# Patient Record
Sex: Female | Born: 1960 | Race: White | Hispanic: No | Marital: Married | Smoking: Never smoker
Health system: Southern US, Community
[De-identification: ages and names within clinical notes are randomized; demographics above are authoritative.]

## PROBLEM LIST (undated history)

## (undated) DIAGNOSIS — K219 Gastro-esophageal reflux disease without esophagitis: Secondary | ICD-10-CM

## (undated) DIAGNOSIS — I10 Essential (primary) hypertension: Secondary | ICD-10-CM

## (undated) DIAGNOSIS — K5000 Crohn's disease of small intestine without complications: Secondary | ICD-10-CM

## (undated) DIAGNOSIS — T8859XA Other complications of anesthesia, initial encounter: Secondary | ICD-10-CM

## (undated) DIAGNOSIS — Z9889 Other specified postprocedural states: Secondary | ICD-10-CM

## (undated) DIAGNOSIS — M542 Cervicalgia: Secondary | ICD-10-CM

## (undated) DIAGNOSIS — K5790 Diverticulosis of intestine, part unspecified, without perforation or abscess without bleeding: Secondary | ICD-10-CM

## (undated) DIAGNOSIS — G8929 Other chronic pain: Secondary | ICD-10-CM

## (undated) DIAGNOSIS — F419 Anxiety disorder, unspecified: Secondary | ICD-10-CM

## (undated) DIAGNOSIS — E785 Hyperlipidemia, unspecified: Secondary | ICD-10-CM

## (undated) DIAGNOSIS — M1990 Unspecified osteoarthritis, unspecified site: Secondary | ICD-10-CM

## (undated) DIAGNOSIS — T7840XA Allergy, unspecified, initial encounter: Secondary | ICD-10-CM

## (undated) DIAGNOSIS — F329 Major depressive disorder, single episode, unspecified: Secondary | ICD-10-CM

## (undated) DIAGNOSIS — E119 Type 2 diabetes mellitus without complications: Secondary | ICD-10-CM

## (undated) DIAGNOSIS — F32A Depression, unspecified: Secondary | ICD-10-CM

## (undated) DIAGNOSIS — R112 Nausea with vomiting, unspecified: Secondary | ICD-10-CM

## (undated) HISTORY — DX: Other chronic pain: G89.29

## (undated) HISTORY — DX: Gastro-esophageal reflux disease without esophagitis: K21.9

## (undated) HISTORY — DX: Allergy, unspecified, initial encounter: T78.40XA

## (undated) HISTORY — DX: Essential (primary) hypertension: I10

## (undated) HISTORY — DX: Depression, unspecified: F32.A

## (undated) HISTORY — DX: Hyperlipidemia, unspecified: E78.5

## (undated) HISTORY — DX: Diverticulosis of intestine, part unspecified, without perforation or abscess without bleeding: K57.90

## (undated) HISTORY — DX: Crohn's disease of small intestine without complications: K50.00

## (undated) HISTORY — DX: Unspecified osteoarthritis, unspecified site: M19.90

## (undated) HISTORY — PX: CHOLECYSTECTOMY: SHX55

## (undated) HISTORY — DX: Anxiety disorder, unspecified: F41.9

## (undated) HISTORY — PX: EXTERNAL EAR SURGERY: SHX627

## (undated) HISTORY — DX: Cervicalgia: M54.2

## (undated) HISTORY — DX: Type 2 diabetes mellitus without complications: E11.9

## (undated) HISTORY — DX: Major depressive disorder, single episode, unspecified: F32.9

---

## 1988-01-09 HISTORY — PX: DG GALL BLADDER: HXRAD326

## 2005-01-08 HISTORY — PX: CERVICAL FUSION: SHX112

## 2017-01-24 ENCOUNTER — Encounter: Payer: Self-pay | Admitting: Primary Care

## 2017-01-24 ENCOUNTER — Ambulatory Visit: Payer: BC Managed Care – PPO | Admitting: Primary Care

## 2017-01-24 ENCOUNTER — Encounter (INDEPENDENT_AMBULATORY_CARE_PROVIDER_SITE_OTHER): Payer: Self-pay

## 2017-01-24 VITALS — BP 122/82 | HR 108 | Temp 98.0°F | Ht 65.0 in | Wt 185.1 lb

## 2017-01-24 DIAGNOSIS — I10 Essential (primary) hypertension: Secondary | ICD-10-CM

## 2017-01-24 DIAGNOSIS — N289 Disorder of kidney and ureter, unspecified: Secondary | ICD-10-CM | POA: Diagnosis not present

## 2017-01-24 DIAGNOSIS — G8929 Other chronic pain: Secondary | ICD-10-CM

## 2017-01-24 DIAGNOSIS — E785 Hyperlipidemia, unspecified: Secondary | ICD-10-CM | POA: Diagnosis not present

## 2017-01-24 DIAGNOSIS — M542 Cervicalgia: Secondary | ICD-10-CM | POA: Diagnosis not present

## 2017-01-24 DIAGNOSIS — K219 Gastro-esophageal reflux disease without esophagitis: Secondary | ICD-10-CM | POA: Insufficient documentation

## 2017-01-24 DIAGNOSIS — E119 Type 2 diabetes mellitus without complications: Secondary | ICD-10-CM

## 2017-01-24 DIAGNOSIS — F419 Anxiety disorder, unspecified: Secondary | ICD-10-CM | POA: Diagnosis not present

## 2017-01-24 DIAGNOSIS — F329 Major depressive disorder, single episode, unspecified: Secondary | ICD-10-CM | POA: Diagnosis not present

## 2017-01-24 DIAGNOSIS — E1165 Type 2 diabetes mellitus with hyperglycemia: Secondary | ICD-10-CM | POA: Insufficient documentation

## 2017-01-24 DIAGNOSIS — F32A Depression, unspecified: Secondary | ICD-10-CM | POA: Insufficient documentation

## 2017-01-24 MED ORDER — METFORMIN HCL 500 MG PO TABS: 500.0000 mg | ORAL_TABLET | Freq: Two times a day (BID) | ORAL | 1 refills | Status: DC

## 2017-01-24 NOTE — Progress Notes (Signed)
Subjective:    Patient ID: Cheyenne Flowers, female    DOB: [birth year], 57 y.o.   MRN: [id number]  HPI  Cheyenne Flowers is a 57 year old female who presents today to establish care and discuss the problems mentioned below. Will obtain old records.  1) Hyperlipidemia: Currently managed on atorvastatin 10 mg. Her last lipid panel was in early December 2018 with TC 151, LDL 84. She denies myalgias.  2) Chronic Neck Pain: Currently managed on gabapentin 1200 mg at bedtime. History of cervical spinal fusion in 2007, does experience residual nerve pain since surgery.  3) GERD: Currently managed on pantoprazole 40 mg. Her symptoms include esophageal burning, epigastric burning without her occasion. She will occasionally experience symptoms while on pantoprazole. She's never tired a lower dose.  4) Essential Hypertension: Currently managed on losartan 25 mg. Previously managed on lisinopril but this was discontinued given side effects of cough and throat congestion.  She thinks she is noticed some improvement with her throat congestion and has noticed resolve of her cough.  BP Readings from Last 3 Encounters:  01/24/17 122/82     5) Type 2 Diabetes: No prior history of diabetes. Recent A1C of 6.6 from early December 2018. She is not currently managed on medication.  She has a strong family history of diabetes in both her mother and brother.  Diet currently consists of:  Breakfast: Cereal, bacon/eggs/biscuits/hashbrowns Lunch: Frozen dinner, cafeteria food (grilled cheese, soup) Dinner: Restaurants,fast food Snacks: Popcorn, candy bars Desserts: Daily  Beverages: Diet mountain dew, kool-aid with sugar, now using Truvia, occasional sweet tea, water  Exercise: She does not currently exercise  6) Depression/Anxiety: Currently managed on nortriptyline 10 mg for which she's taken for 10 years. It was initially introduced for anxiety. She feels as though she does experience intermittent anxiety with  some depression and feels well managed on this medication.     Review of Systems  Constitutional: Negative for fatigue.  HENT: Positive for postnasal drip.   Eyes: Negative for visual disturbance.  Respiratory: Negative for shortness of breath.   Cardiovascular: Negative for chest pain.  Gastrointestinal:       GERD  Genitourinary:       IUD  Allergic/Immunologic: Positive for environmental allergies.  Neurological: Negative for dizziness, numbness and headaches.  Psychiatric/Behavioral:       See HPI       Past Medical History:  Diagnosis Date  . Allergy   . Arthritis   . GERD (gastroesophageal reflux disease)   . Hyperlipidemia   . Hypertension      Social History   Socioeconomic History  . Marital status: Married    Spouse name: Not on file  . Number of children: Not on file  . Years of education: Not on file  . Highest education level: Not on file  Social Needs  . Financial resource strain: Not on file  . Food insecurity - worry: Not on file  . Food insecurity - inability: Not on file  . Transportation needs - medical: Not on file  . Transportation needs - non-medical: Not on file  Occupational History  . Not on file  Tobacco Use  . Smoking status: Never Smoker  . Smokeless tobacco: Never Used  Substance and Sexual Activity  . Alcohol use: Not on file  . Drug use: Not on file  . Sexual activity: Not on file  Other Topics Concern  . Not on file  Social History Narrative   Married.   5  children, 9 grandchildren.   Works in administration.    Enjoys watching her grandchildren, spending time with family.      Family History  Problem Relation Age of Onset  . COPD Mother   . Diabetes Mother   . Heart disease Mother   . Diabetes Brother   . Hypertension Brother     Allergies  Allergen Reactions  . Latex Hives and Rash  . Clindamycin Rash  . Codeine Nausea Only    Other reaction(s): Other (See Comments) Sick feeling   . Quinolones Rash     Current Outpatient Medications on File Prior to Visit  Medication Sig Dispense Refill  . atorvastatin (LIPITOR) 10 MG tablet TAKE 1 TABLET BY MOUTH EVERY DAY    . cetirizine (ZYRTEC) 10 MG tablet Take 10 mg by mouth daily.     Marland Kitchen gabapentin (NEURONTIN) 600 MG tablet TAKE 2 TABLETS (1,200 MG TOTAL) BY MOUTH NIGHTLY  1  . losartan (COZAAR) 25 MG tablet Take 25 mg by mouth daily.  1  . nortriptyline (PAMELOR) 10 MG capsule TAKE 4 CAPSULES (40 MG TOTAL) BY MOUTH NIGHTLY. OR AS DIRECTED    . pantoprazole (PROTONIX) 40 MG tablet Take 40 mg by mouth.     No current facility-administered medications on file prior to visit.     BP 122/82   Pulse (!) 108   Temp 98 F (36.7 C) (Oral)   Ht 5\' 5"  (1.651 m)   Wt 185 lb 1.9 oz (84 kg)   SpO2 99%   BMI 30.81 kg/m    Objective:   Physical Exam  Constitutional: She appears well-nourished.  HENT:  Mouth/Throat: Oropharynx is clear and moist.  Neck: Neck supple.  Cardiovascular: Normal rate and regular rhythm.  Pulmonary/Chest: Effort normal and breath sounds normal.  Musculoskeletal: Normal range of motion.  Skin: Skin is warm and dry.  Psychiatric: She has a normal mood and affect.          Assessment & Plan:

## 2017-01-24 NOTE — Assessment & Plan Note (Signed)
Stable in the office today.  Continue losartan 25 mg for blood pressure control and also for renal protection against diabetes.

## 2017-01-24 NOTE — Assessment & Plan Note (Signed)
We will have her trial a reduction in her pantoprazole to 20 mg daily.  She will update if symptoms return.

## 2017-01-24 NOTE — Assessment & Plan Note (Signed)
Diagnosed in early December 2018, no current treatment in place.  Prescription for metformin 500 mg twice daily sent to pharmacy.  Discussed to start with 1 tablet once daily for 2 weeks, then increase to 1 tablet twice daily thereafter.  Managed on ARB and statin. Foot exam next visit. Will need pneumonia vaccination next visit.  We will have her work on her diet and start exercising regularly.  Follow-up in 3 months with repeat A1c.

## 2017-01-24 NOTE — Patient Instructions (Addendum)
Start metformin 500 mg tablets for diabetes. Take 1 tablet by mouth once daily for 2 weeks, then increase to 1 tablet twice daily thereafter.  Try to reduce the dose of your pantoprazole from 40 mg to 20 mg.   Avoid medications like Ibuprofen, Advil, Motrin, naproxen, Aleve as they can be toxic to the kidneys.  Schedule a follow up visit in 3 months with a lab visit 1-2 days prior. We will discuss your labs during your visit.  It was a pleasure to meet you today! Please don't hesitate to call or message me with any questions. Welcome to [facility]!    Diabetes Mellitus and Nutrition When you have diabetes (diabetes mellitus), it is very important to have healthy eating habits because your blood sugar (glucose) levels are greatly affected by what you eat and drink. Eating healthy foods in the appropriate amounts, at about the same times every day, can help you:  Control your blood glucose.  Lower your risk of heart disease.  Improve your blood pressure.  Reach or maintain a healthy weight.  Every person with diabetes is different, and each person has different needs for a meal plan. Your health care provider may recommend that you work with a diet and nutrition specialist (dietitian) to make a meal plan that is best for you. Your meal plan may vary depending on factors such as:  The calories you need.  The medicines you take.  Your weight.  Your blood glucose, blood pressure, and cholesterol levels.  Your activity level.  Other health conditions you have, such as heart or kidney disease.  How do carbohydrates affect me? Carbohydrates affect your blood glucose level more than any other type of food. Eating carbohydrates naturally increases the amount of glucose in your blood. Carbohydrate counting is a method for keeping track of how many carbohydrates you eat. Counting carbohydrates is important to keep your blood glucose at a healthy level, especially if you use insulin or take  certain oral diabetes medicines. It is important to know how many carbohydrates you can safely have in each meal. This is different for every person. Your dietitian can help you calculate how many carbohydrates you should have at each meal and for snack. Foods that contain carbohydrates include:  Bread, cereal, rice, pasta, and crackers.  Potatoes and corn.  Peas, beans, and lentils.  Milk and yogurt.  Fruit and juice.  Desserts, such as cakes, cookies, ice cream, and candy.  How does alcohol affect me? Alcohol can cause a sudden decrease in blood glucose (hypoglycemia), especially if you use insulin or take certain oral diabetes medicines. Hypoglycemia can be a life-threatening condition. Symptoms of hypoglycemia (sleepiness, dizziness, and confusion) are similar to symptoms of having too much alcohol. If your health care provider says that alcohol is safe for you, follow these guidelines:  Limit alcohol intake to no more than 1 drink per day for nonpregnant women and 2 drinks per day for men. One drink equals 12 oz of beer, 5 oz of wine, or 1 oz of hard liquor.  Do not drink on an empty stomach.  Keep yourself hydrated with water, diet soda, or unsweetened iced tea.  Keep in mind that regular soda, juice, and other mixers may contain a lot of sugar and must be counted as carbohydrates.  What are tips for following this plan? Reading food labels  Start by checking the serving size on the label. The amount of calories, carbohydrates, fats, and other nutrients listed on the  label are based on one serving of the food. Many foods contain more than one serving per package.  Check the total grams (g) of carbohydrates in one serving. You can calculate the number of servings of carbohydrates in one serving by dividing the total carbohydrates by 15. For example, if a food has 30 g of total carbohydrates, it would be equal to 2 servings of carbohydrates.  Check the number of grams (g) of  saturated and trans fats in one serving. Choose foods that have low or no amount of these fats.  Check the number of milligrams (mg) of sodium in one serving. Most people should limit total sodium intake to less than 2,300 mg per day.  Always check the nutrition information of foods labeled as "low-fat" or "nonfat". These foods may be higher in added sugar or refined carbohydrates and should be avoided.  Talk to your dietitian to identify your daily goals for nutrients listed on the label. Shopping  Avoid buying canned, premade, or processed foods. These foods tend to be high in fat, sodium, and added sugar.  Shop around the outside edge of the grocery store. This includes fresh fruits and vegetables, bulk grains, fresh meats, and fresh dairy. Cooking  Use low-heat cooking methods, such as baking, instead of high-heat cooking methods like deep frying.  Cook using healthy oils, such as olive, canola, or sunflower oil.  Avoid cooking with butter, cream, or high-fat meats. Meal planning  Eat meals and snacks regularly, preferably at the same times every day. Avoid going long periods of time without eating.  Eat foods high in fiber, such as fresh fruits, vegetables, beans, and whole grains. Talk to your dietitian about how many servings of carbohydrates you can eat at each meal.  Eat 4-6 ounces of lean protein each day, such as lean meat, chicken, fish, eggs, or tofu. 1 ounce is equal to 1 ounce of meat, chicken, or fish, 1 egg, or 1/4 cup of tofu.  Eat some foods each day that contain healthy fats, such as avocado, nuts, seeds, and fish. Lifestyle   Check your blood glucose regularly.  Exercise at least 30 minutes 5 or more days each week, or as told by your health care provider.  Take medicines as told by your health care provider.  Do not use any products that contain nicotine or tobacco, such as cigarettes and e-cigarettes. If you need help quitting, ask your health care  provider.  Work with a Veterinary surgeoncounselor or diabetes educator to identify strategies to manage stress and any emotional and social challenges. What are some questions to ask my health care provider?  Do I need to meet with a diabetes educator?  Do I need to meet with a dietitian?  What number can I call if I have questions?  When are the best times to check my blood glucose? Where to find more information:  American Diabetes Association: diabetes.org/food-and-fitness/food  Academy of Nutrition and Dietetics: https://www.vargas.com/www.eatright.org/resources/health/diseases-and-conditions/diabetes  [employer] Institute of Diabetes and Digestive and Kidney Diseases (NIH): FindJewelers.czwww.niddk.nih.gov/health-information/diabetes/overview/diet-eating-physical-activity Summary  A healthy meal plan will help you control your blood glucose and maintain a healthy lifestyle.  Working with a diet and nutrition specialist (dietitian) can help you make a meal plan that is best for you.  Keep in mind that carbohydrates and alcohol have immediate effects on your blood glucose levels. It is important to count carbohydrates and to use alcohol carefully. This information is not intended to replace advice given to you by your health care  provider. Make sure you discuss any questions you have with your health care provider. Document Released: 09/21/2004 Document Revised: 01/30/2016 Document Reviewed: 01/30/2016 Elsevier Interactive Patient Education  [employer].

## 2017-01-24 NOTE — Assessment & Plan Note (Signed)
Managed on nortriptyline 10 mg for years, doing well.  Continue same.

## 2017-01-24 NOTE — Assessment & Plan Note (Signed)
GFR of 58 from labs in December 2018. We will continue to monitor renal function with repeat in 3 months at her next visit. Discussed to avoid nephrotoxic agents such as NSAIDs.

## 2017-01-24 NOTE — Assessment & Plan Note (Signed)
Lipid panel reviewed from early December 2018 and is stable.  Continue atorvastatin 10 mg.

## 2017-01-24 NOTE — Assessment & Plan Note (Signed)
Overall doing better on gabapentin 1200 mg at bedtime.  Has weaned herself down over time on gabapentin.  Continue same.

## 2017-02-08 LAB — HM DIABETES EYE EXAM

## 2017-02-12 ENCOUNTER — Encounter: Payer: Self-pay | Admitting: Primary Care

## 2017-02-12 DIAGNOSIS — E119 Type 2 diabetes mellitus without complications: Secondary | ICD-10-CM

## 2017-02-12 MED ORDER — METFORMIN HCL ER 500 MG PO TB24: 500.0000 mg | ORAL_TABLET | Freq: Every day | ORAL | 0 refills | Status: DC

## 2017-03-18 ENCOUNTER — Encounter: Payer: Self-pay | Admitting: Primary Care

## 2017-03-18 DIAGNOSIS — E785 Hyperlipidemia, unspecified: Secondary | ICD-10-CM

## 2017-03-18 DIAGNOSIS — I10 Essential (primary) hypertension: Secondary | ICD-10-CM

## 2017-03-18 MED ORDER — LOSARTAN POTASSIUM 25 MG PO TABS: 25.0000 mg | ORAL_TABLET | Freq: Every day | ORAL | 2 refills | Status: DC

## 2017-03-18 MED ORDER — ATORVASTATIN CALCIUM 10 MG PO TABS: 10.0000 mg | ORAL_TABLET | Freq: Every evening | ORAL | 2 refills | Status: DC

## 2017-03-18 NOTE — Telephone Encounter (Signed)
Ok to refill? Electronically refill request through MyChart. Medications have not been prescribed by Jae DireKate. Last seen on 01/24/2017.

## 2017-04-22 ENCOUNTER — Encounter: Payer: Self-pay | Admitting: Primary Care

## 2017-04-22 ENCOUNTER — Other Ambulatory Visit (INDEPENDENT_AMBULATORY_CARE_PROVIDER_SITE_OTHER): Payer: BC Managed Care – PPO

## 2017-04-22 DIAGNOSIS — E119 Type 2 diabetes mellitus without complications: Secondary | ICD-10-CM

## 2017-04-22 DIAGNOSIS — N289 Disorder of kidney and ureter, unspecified: Secondary | ICD-10-CM | POA: Diagnosis not present

## 2017-04-22 LAB — BASIC METABOLIC PANEL
BUN: 12 mg/dL (ref 6–23)
CALCIUM: 9.3 mg/dL (ref 8.4–10.5)
CO2: 27 meq/L (ref 19–32)
Chloride: 103 mEq/L (ref 96–112)
Creatinine, Ser: 0.96 mg/dL (ref 0.40–1.20)
GFR: 64.28 mL/min (ref >60.00)
Glucose, Bld: 126 mg/dL — ABNORMAL HIGH (ref 70–99)
Potassium: 4.1 mEq/L (ref 3.5–5.1)
SODIUM: 140 meq/L (ref 135–145)

## 2017-04-22 LAB — HEMOGLOBIN A1C: HEMOGLOBIN A1C: 6.3 % (ref 4.6–6.5)

## 2017-04-24 ENCOUNTER — Ambulatory Visit: Payer: BC Managed Care – PPO | Admitting: Primary Care

## 2017-04-24 ENCOUNTER — Encounter: Payer: Self-pay | Admitting: Primary Care

## 2017-04-24 VITALS — BP 114/70 | HR 103 | Temp 98.0°F | Ht 65.0 in | Wt 185.2 lb

## 2017-04-24 DIAGNOSIS — E119 Type 2 diabetes mellitus without complications: Secondary | ICD-10-CM

## 2017-04-24 DIAGNOSIS — Z23 Encounter for immunization: Secondary | ICD-10-CM | POA: Diagnosis not present

## 2017-04-24 DIAGNOSIS — F329 Major depressive disorder, single episode, unspecified: Secondary | ICD-10-CM | POA: Diagnosis not present

## 2017-04-24 DIAGNOSIS — F419 Anxiety disorder, unspecified: Secondary | ICD-10-CM | POA: Diagnosis not present

## 2017-04-24 DIAGNOSIS — K219 Gastro-esophageal reflux disease without esophagitis: Secondary | ICD-10-CM

## 2017-04-24 DIAGNOSIS — F32A Depression, unspecified: Secondary | ICD-10-CM

## 2017-04-24 MED ORDER — ZOSTER VAC RECOMB ADJUVANTED 50 MCG/0.5ML IM SUSR: 0.5000 mL | Freq: Once | INTRAMUSCULAR | 1 refills | Status: AC

## 2017-04-24 MED ORDER — PANTOPRAZOLE SODIUM 20 MG PO TBEC: 20.0000 mg | DELAYED_RELEASE_TABLET | Freq: Every day | ORAL | 1 refills | Status: DC

## 2017-04-24 NOTE — Progress Notes (Signed)
Subjective:    Patient ID: Cheyenne Flowers, female    DOB: 06-09-1960, 57 y.o.   MRN: [id number]  HPI  Cheyenne Flowers is a 57 year old female who presents today for diabetes follow up. She is interested in the Shingrix vaccination.  Current medications include: Metformin XR 500 mg daily  Last A1C: 6.6 in December 2018. 6.3 in April 2019.  Last Eye Exam: Completed in February 2019 Last Foot Exam: Due today Pneumonia Vaccination: Due today. ACE/ARB: Losartan  Statin: Lipitor   Diet currently consists of:  Breakfast: Cereal, eggs with cheese, hashbrowns Lunch: Caesar salad, fruit, grilled chicken breast, sandwich Dinner: Restaurants, meat, pasta, vegetables  Snacks: Almonds, fruit Desserts: Ice cream, chocolate. Daily consumption  Beverages: Hint water, [employer], diet soda  Exercise: She is not exercising, plans on starting.     Review of Systems  Respiratory: Negative for shortness of breath.   Cardiovascular: Negative for chest pain.  Gastrointestinal: Negative for diarrhea.  Neurological: Negative for dizziness, numbness and headaches.       Past Medical History:  Diagnosis Date  . Allergy   . Anxiety and depression   . Arthritis   . Chronic neck pain   . GERD (gastroesophageal reflux disease)   . Hyperlipidemia   . Hypertension   . Type 2 diabetes mellitus (HCC)      Social History   Socioeconomic History  . Marital status: Married    Spouse name: Not on file  . Number of children: Not on file  . Years of education: Not on file  . Highest education level: Not on file  Occupational History  . Not on file  Social Needs  . Financial resource strain: Not on file  . Food insecurity:    Worry: Not on file    Inability: Not on file  . Transportation needs:    Medical: Not on file    Non-medical: Not on file  Tobacco Use  . Smoking status: Never Smoker  . Smokeless tobacco: Never Used  Substance and Sexual Activity  . Alcohol use: Not on file  . Drug  use: Not on file  . Sexual activity: Not on file  Lifestyle  . Physical activity:    Days per week: Not on file    Minutes per session: Not on file  . Stress: Not on file  Relationships  . Social connections:    Talks on phone: Not on file    Gets together: Not on file    Attends religious service: Not on file    Active member of club or organization: Not on file    Attends meetings of clubs or organizations: Not on file    Relationship status: Not on file  . Intimate partner violence:    Fear of current or ex partner: Not on file    Emotionally abused: Not on file    Physically abused: Not on file    Forced sexual activity: Not on file  Other Topics Concern  . Not on file  Social History Narrative   Married.   5 children, 9 grandchildren.   Works in administration.    Enjoys watching her grandchildren, spending time with family.     Family History  Problem Relation Age of Onset  . COPD Mother   . Diabetes Mother   . Heart disease Mother   . Diabetes Brother   . Hypertension Brother     Allergies  Allergen Reactions  . Latex Hives and Rash  .  Clindamycin Rash  . Codeine Nausea Only    Other reaction(s): Other (See Comments) Sick feeling   . Quinolones Rash    Current Outpatient Medications on File Prior to Visit  Medication Sig Dispense Refill  . atorvastatin (LIPITOR) 10 MG tablet Take 1 tablet (10 mg total) by mouth every evening. 90 tablet 2  . cetirizine (ZYRTEC) 10 MG tablet Take 10 mg by mouth daily.     Marland Kitchen. gabapentin (NEURONTIN) 600 MG tablet TAKE 2 TABLETS (1,200 MG TOTAL) BY MOUTH NIGHTLY  1  . losartan (COZAAR) 25 MG tablet Take 1 tablet (25 mg total) by mouth daily. 90 tablet 2  . metFORMIN (GLUCOPHAGE XR) 500 MG 24 hr tablet Take 1 tablet (500 mg total) by mouth daily with breakfast. 90 tablet 0   No current facility-administered medications on file prior to visit.     BP 114/70   Pulse (!) 103   Temp 98 F (36.7 C) (Oral)   Ht 5\' 5"  (1.651  m)   Wt 185 lb 4 oz (84 kg)   SpO2 98%   BMI 30.83 kg/m    Objective:   Physical Exam  Constitutional: She appears well-nourished.  Neck: Neck supple.  Cardiovascular: Normal rate and regular rhythm.  Pulmonary/Chest: Effort normal and breath sounds normal.  Skin: Skin is warm and dry.          Assessment & Plan:

## 2017-04-24 NOTE — Assessment & Plan Note (Signed)
Weaned herself of nortriptyline and has been without for the past 2 nights. Also prescribed for IBS diarrhea. Overall doing fine off meds. She will update if symptoms return.

## 2017-04-24 NOTE — Patient Instructions (Addendum)
Continue Metformin XR 500 mg tablets for diabetes.  Start exercising. You should be getting 150 minutes of moderate intensity exercise weekly.  Continue to work on M.D.C. Holdingsyour diet, increase water intake.   Take the Shingles vaccination to your pharmacy no sooner than one month after the pneumonia vaccination.   Please schedule a follow up appointment in 6 months for diabetes follow up.   It was a pleasure to see you today!

## 2017-04-24 NOTE — Assessment & Plan Note (Signed)
A1C improved on Metformin XR 500 mg once daily, continue same.  Foot exam completed today. Pneumovax provided today. Managed on ARB and statin. Eye exam UTD.  Follow up in 6 months.

## 2017-05-01 ENCOUNTER — Other Ambulatory Visit: Payer: Self-pay | Admitting: Primary Care

## 2017-05-01 DIAGNOSIS — E119 Type 2 diabetes mellitus without complications: Secondary | ICD-10-CM

## 2017-05-16 ENCOUNTER — Ambulatory Visit: Payer: BC Managed Care – PPO | Admitting: Primary Care

## 2017-05-16 ENCOUNTER — Encounter: Payer: Self-pay | Admitting: Primary Care

## 2017-05-16 VITALS — BP 122/76 | HR 111 | Temp 98.1°F | Ht 65.0 in | Wt 185.0 lb

## 2017-05-16 DIAGNOSIS — K529 Noninfective gastroenteritis and colitis, unspecified: Secondary | ICD-10-CM

## 2017-05-16 DIAGNOSIS — R1084 Generalized abdominal pain: Secondary | ICD-10-CM | POA: Diagnosis not present

## 2017-05-16 HISTORY — DX: Generalized abdominal pain: R10.84

## 2017-05-16 LAB — COMPREHENSIVE METABOLIC PANEL
ALBUMIN: 4.2 g/dL (ref 3.5–5.2)
ALK PHOS: 81 U/L (ref 39–117)
ALT: 23 U/L (ref 0–35)
AST: 21 U/L (ref 0–37)
BUN: 10 mg/dL (ref 6–23)
CALCIUM: 9.6 mg/dL (ref 8.4–10.5)
CHLORIDE: 104 meq/L (ref 96–112)
CO2: 30 mEq/L (ref 19–32)
CREATININE: 0.98 mg/dL (ref 0.40–1.20)
GFR: 62.75 mL/min (ref >60.00)
Glucose, Bld: 113 mg/dL — ABNORMAL HIGH (ref 70–99)
POTASSIUM: 3.8 meq/L (ref 3.5–5.1)
Sodium: 140 mEq/L (ref 135–145)
TOTAL PROTEIN: 7.3 g/dL (ref 6.0–8.3)
Total Bilirubin: 0.9 mg/dL (ref 0.2–1.2)

## 2017-05-16 LAB — CBC WITH DIFFERENTIAL/PLATELET
BASOS PCT: 0.9 % (ref 0.0–3.0)
Basophils Absolute: 0.1 10*3/uL (ref 0.0–0.1)
EOS PCT: 3.2 % (ref 0.0–5.0)
Eosinophils Absolute: 0.3 10*3/uL (ref 0.0–0.7)
HEMATOCRIT: 39.8 % (ref 36.0–46.0)
HEMOGLOBIN: 13.3 g/dL (ref 12.0–15.0)
LYMPHS PCT: 29.2 % (ref 12.0–46.0)
Lymphs Abs: 2.5 10*3/uL (ref 0.7–4.0)
MCHC: 33.5 g/dL (ref 30.0–36.0)
MCV: 91.2 fl (ref 78.0–100.0)
MONO ABS: 0.7 10*3/uL (ref 0.1–1.0)
Monocytes Relative: 8.3 % (ref 3.0–12.0)
Neutro Abs: 5.1 10*3/uL (ref 1.4–7.7)
Neutrophils Relative %: 58.4 % (ref 43.0–77.0)
Platelets: 362 10*3/uL (ref 150.0–400.0)
RBC: 4.37 Mil/uL (ref 3.87–5.11)
RDW: 12.8 % (ref 11.5–15.5)
WBC: 8.7 10*3/uL (ref 4.0–10.5)

## 2017-05-16 MED ORDER — AMITRIPTYLINE HCL 10 MG PO TABS: ORAL_TABLET | ORAL | 0 refills | Status: DC

## 2017-05-16 NOTE — Patient Instructions (Signed)
Stop by the lab prior to leaving today. I will notify you of your results once received.   Start amitriptyline 10 mg tablets every night at bedtime for diarrhea.   Please update me in a few weeks.  It was a pleasure to see you today!

## 2017-05-16 NOTE — Assessment & Plan Note (Signed)
Since gall bladder removed in 1990. Chronic abdominal pain for years, worse recently. Suspect IBS related symptoms, diarrhea type. Suspect symptoms have become worse since coming off of nortriptyline.  Check labs including CBC, CMP. Given chronicity and complaints of increased abdominal pain, will check CT abdomen/pelvis to rule out any colon cause or masses.  Rx for amitriptyline sent to pharmacy. She will update in a few weeks.

## 2017-05-16 NOTE — Assessment & Plan Note (Signed)
Chronic for years, worse recently. Check CBC and CMP. Check CT abdomen/pelvis to rule out colon causes, masses, etc.

## 2017-05-16 NOTE — Progress Notes (Signed)
Subjective:    Patient ID: Cheyenne Flowers, female    DOB: 18-May-1960, 57 y.o.   MRN: [id number]  HPI  [name] is a 57 year old female with a history of type 2 diabetes, GERD who presents today with a chief complaint of abdominal pain. She also reports diarrhea and nausea.   Her pain is located to the left lateral side of her mid to lower abdomen that has been intermittent for the past one year. She'll also experience generalized abdominal pain several times weekly for which she describes as "a stomach ache". She's also experiencing diarrhea which has been chronic for years since removal of her gall bladder in 1990. Diarrhea will occur 2-3 times daily that will occur within minutes after eating a meal. She's been taking Imodium several times weekly (2-3 at a time) with temporary improvement. The main difference in her chronic symptoms is that her abdominal pain is worse.   She was once on nortriptyline 40 mg for diarrhea, stopped taking about 6 weeks ago. She has not identified any foods that make symptoms worse. She denies vomiting, rectal bleeding, unexplained weight loss. She underwent colonoscopy in 2015, no evidence of Crohn's, ulcerative colitis, or diverticulosis. History of cholecystectomy in 1990.   Her symptoms have significantly altered her daily routine. She can no longer go on long motorcycle rides with her husband as she'll have episodes of diarrhea and pain.  Review of Systems  Constitutional: Negative for fever and unexpected weight change.  Respiratory: Negative for shortness of breath.   Cardiovascular: Negative for chest pain.  Gastrointestinal: Positive for abdominal pain, diarrhea and nausea. Negative for blood in stool, constipation and vomiting.  Neurological: Negative for weakness.       Past Medical History:  Diagnosis Date  . Allergy   . Anxiety and depression   . Arthritis   . Chronic neck pain   . GERD (gastroesophageal reflux disease)   .  Hyperlipidemia   . Hypertension   . Type 2 diabetes mellitus (HCC)      Social History   Socioeconomic History  . Marital status: Married    Spouse name: Not on file  . Number of children: Not on file  . Years of education: Not on file  . Highest education level: Not on file  Occupational History  . Not on file  Social Needs  . Financial resource strain: Not on file  . Food insecurity:    Worry: Not on file    Inability: Not on file  . Transportation needs:    Medical: Not on file    Non-medical: Not on file  Tobacco Use  . Smoking status: Never Smoker  . Smokeless tobacco: Never Used  Substance and Sexual Activity  . Alcohol use: Not on file  . Drug use: Not on file  . Sexual activity: Not on file  Lifestyle  . Physical activity:    Days per week: Not on file    Minutes per session: Not on file  . Stress: Not on file  Relationships  . Social connections:    Talks on phone: Not on file    Gets together: Not on file    Attends religious service: Not on file    Active member of club or organization: Not on file    Attends meetings of clubs or organizations: Not on file    Relationship status: Not on file  . Intimate partner violence:    Fear of current or ex partner: Not  on file    Emotionally abused: Not on file    Physically abused: Not on file    Forced sexual activity: Not on file  Other Topics Concern  . Not on file  Social History Narrative   Married.   5 children, 9 grandchildren.   Works in administration.    Enjoys watching her grandchildren, spending time with family.     Family History  Problem Relation Age of Onset  . COPD Mother   . Diabetes Mother   . Heart disease Mother   . Diabetes Brother   . Hypertension Brother     Allergies  Allergen Reactions  . Latex Hives and Rash  . Clindamycin Rash  . Codeine Nausea Only    Other reaction(s): Other (See Comments) Sick feeling   . Quinolones Rash    Current Outpatient Medications on  File Prior to Visit  Medication Sig Dispense Refill  . atorvastatin (LIPITOR) 10 MG tablet Take 1 tablet (10 mg total) by mouth every evening. 90 tablet 2  . cetirizine (ZYRTEC) 10 MG tablet Take 10 mg by mouth daily.     Marland Kitchen gabapentin (NEURONTIN) 600 MG tablet TAKE 2 TABLETS (1,200 MG TOTAL) BY MOUTH NIGHTLY  1  . losartan (COZAAR) 25 MG tablet Take 1 tablet (25 mg total) by mouth daily. 90 tablet 2  . metFORMIN (GLUCOPHAGE-XR) 500 MG 24 hr tablet TAKE 1 TABLET BY MOUTH EVERY DAY WITH BREAKFAST 90 tablet 0  . pantoprazole (PROTONIX) 20 MG tablet Take 1 tablet (20 mg total) by mouth daily. 90 tablet 1   No current facility-administered medications on file prior to visit.     BP 122/76   Pulse (!) 111   Temp 98.1 F (36.7 C) (Oral)   Ht  (1.651 m)   Wt 185 lb (83.9 kg)   SpO2 98%   BMI 30.79 kg/m    Objective:   Physical Exam  Constitutional: She appears well-nourished. She does not appear ill.  Cardiovascular: Normal rate and regular rhythm.  Pulmonary/Chest: Effort normal and breath sounds normal.  Abdominal: Soft. Normal appearance and bowel sounds are normal. There is no tenderness. There is no CVA tenderness.  Skin: Skin is warm and dry.          Assessment & Plan:

## 2017-05-24 ENCOUNTER — Encounter: Payer: Self-pay | Admitting: Primary Care

## 2017-05-27 ENCOUNTER — Other Ambulatory Visit: Payer: Self-pay | Admitting: Primary Care

## 2017-05-27 ENCOUNTER — Encounter: Payer: Self-pay | Admitting: Primary Care

## 2017-05-27 DIAGNOSIS — K5000 Crohn's disease of small intestine without complications: Secondary | ICD-10-CM

## 2017-05-27 DIAGNOSIS — N2889 Other specified disorders of kidney and ureter: Secondary | ICD-10-CM

## 2017-05-28 ENCOUNTER — Encounter: Payer: Self-pay | Admitting: Primary Care

## 2017-05-28 NOTE — Telephone Encounter (Signed)
FYI for [employer].

## 2017-05-31 ENCOUNTER — Ambulatory Visit
Admission: RE | Admit: 2017-05-31 | Discharge: 2017-05-31 | Disposition: A | Discharge status: Home / Self Care | Payer: BC Managed Care – PPO | Source: Ambulatory Visit | Attending: Primary Care | Admitting: Primary Care

## 2017-05-31 DIAGNOSIS — N2889 Other specified disorders of kidney and ureter: Secondary | ICD-10-CM | POA: Diagnosis present

## 2017-05-31 DIAGNOSIS — N281 Cyst of kidney, acquired: Secondary | ICD-10-CM | POA: Insufficient documentation

## 2017-05-31 MED ORDER — GADOBENATE DIMEGLUMINE 529 MG/ML IV SOLN
17.0000 mL | Freq: Once | INTRAVENOUS | Status: AC | PRN
  Administered 2017-05-31: 17 mL via INTRAVENOUS

## 2017-07-12 ENCOUNTER — Encounter: Payer: Self-pay | Admitting: Primary Care

## 2017-07-12 MED ORDER — CETIRIZINE HCL 10 MG PO TABS: 10.0000 mg | ORAL_TABLET | Freq: Every day | ORAL | 1 refills | Status: DC

## 2017-07-18 ENCOUNTER — Encounter: Payer: Self-pay | Admitting: Primary Care

## 2017-07-22 ENCOUNTER — Encounter: Payer: Self-pay | Admitting: Primary Care

## 2017-07-22 DIAGNOSIS — G8929 Other chronic pain: Secondary | ICD-10-CM

## 2017-07-22 DIAGNOSIS — M542 Cervicalgia: Principal | ICD-10-CM

## 2017-07-23 MED ORDER — GABAPENTIN 600 MG PO TABS: ORAL_TABLET | ORAL | 1 refills | Status: DC

## 2017-07-23 NOTE — Telephone Encounter (Signed)
Refill request for gabapentin (NEURONTIN) 600 MG tablet  Medication have not been prescribed by Mayra ReelKate Clark.  Last office visit on 05/16/2017

## 2017-08-03 ENCOUNTER — Other Ambulatory Visit: Payer: Self-pay | Admitting: Primary Care

## 2017-08-03 DIAGNOSIS — E119 Type 2 diabetes mellitus without complications: Secondary | ICD-10-CM

## 2017-08-11 ENCOUNTER — Other Ambulatory Visit: Payer: Self-pay | Admitting: Primary Care

## 2017-08-11 DIAGNOSIS — K529 Noninfective gastroenteritis and colitis, unspecified: Secondary | ICD-10-CM

## 2017-10-16 ENCOUNTER — Other Ambulatory Visit: Payer: Self-pay | Admitting: Primary Care

## 2017-10-16 DIAGNOSIS — R7303 Prediabetes: Secondary | ICD-10-CM

## 2017-10-21 ENCOUNTER — Other Ambulatory Visit (INDEPENDENT_AMBULATORY_CARE_PROVIDER_SITE_OTHER): Payer: BC Managed Care – PPO

## 2017-10-21 DIAGNOSIS — R7303 Prediabetes: Secondary | ICD-10-CM | POA: Diagnosis not present

## 2017-10-21 LAB — POCT GLYCOSYLATED HEMOGLOBIN (HGB A1C): HEMOGLOBIN A1C: 6.3 % — AB (ref 4.0–5.6)

## 2017-10-23 ENCOUNTER — Encounter: Payer: Self-pay | Admitting: Primary Care

## 2017-10-23 ENCOUNTER — Ambulatory Visit: Payer: BC Managed Care – PPO | Admitting: Primary Care

## 2017-10-23 DIAGNOSIS — K529 Noninfective gastroenteritis and colitis, unspecified: Secondary | ICD-10-CM | POA: Diagnosis not present

## 2017-10-23 DIAGNOSIS — E119 Type 2 diabetes mellitus without complications: Secondary | ICD-10-CM | POA: Diagnosis not present

## 2017-10-23 DIAGNOSIS — K219 Gastro-esophageal reflux disease without esophagitis: Secondary | ICD-10-CM | POA: Diagnosis not present

## 2017-10-23 NOTE — Assessment & Plan Note (Signed)
Recent A1C of 6.3 off of Metformin. She is controlled off of medication, will continue to monitor. Discussed to work on diet and increase exercise.

## 2017-10-23 NOTE — Assessment & Plan Note (Signed)
Improved since eliminating metformin and decreased frequency of pantoprazole. Continue to monitor.

## 2017-10-23 NOTE — Assessment & Plan Note (Signed)
Pantoprazole causing diarrhea but is now taking PRN with sufficient relief. Denies SI/HI.

## 2017-10-23 NOTE — Patient Instructions (Signed)
Continue to work on [employer]. Increase vegetables, whole grains, lean protein.  Ensure you are consuming 64 ounces of water daily.  Continue exercising. You should be getting 150 minutes of moderate intensity exercise weekly.  Please schedule a physical with me in 6 months. You may also schedule a lab only appointment 3-4 days prior. We will discuss your lab results in detail during your physical.  It was a pleasure to see you today!

## 2017-10-23 NOTE — Progress Notes (Signed)
Subjective:    Patient ID: Cheyenne Flowers, female    DOB: [birth year], 57 y.o.   MRN: [id number]  HPI  Cheyenne Flowers is a 57 year old female who presents today for follow up.  Current medications include: Metformin ER 500 mg daily. She stopped her Metformin in June 2019 due to persistent diarrhea. Diarrhea has improved.  Last A1C: 6.3 in April 2019, 6.3 in October 2019 Last Eye Exam: Completed in February 2019 Last Foot Exam: Completed in April 2019 Pneumonia Vaccination: Completed in 2019 ACE/ARB: losartan Statin: atorvastatin   Diet currently consists of:  Breakfast: Biscuits and gravy  Lunch: Side salad, meat, vegetables  Dinner: Restaurants, meat, vegetables, starch Snacks: String cheese, nuts Desserts: 2-3 times weekly  Beverages: Diet soda, water, flavored water, juice  Exercise: She is not exercising, some walking on her lunch break at work.    BP Readings from Last 3 Encounters:  10/23/17 124/84  05/16/17 122/76  04/24/17 114/70       Review of Systems  Respiratory: Negative for shortness of breath.   Cardiovascular: Negative for chest pain.  Gastrointestinal:       Less frequent diarrhea  Neurological: Negative for numbness.       Past Medical History:  Diagnosis Date  . Allergy   . Anxiety and depression   . Arthritis   . Chronic neck pain   . Diverticulosis   . GERD (gastroesophageal reflux disease)   . Hyperlipidemia   . Hypertension   . 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 Type 2 diabetes mellitus (HCC)      Social History   Socioeconomic History  . Marital status: Married    Spouse name: Not on file  . Number of children: Not on file  . Years of education: Not on file  . Highest education level: Not on file  Occupational History  . Not on file  Social Needs  . Financial resource strain: Not on file  . Food insecurity:    Worry: Not on file    Inability: Not on file  . Transportation needs:    Medical: Not on file    Non-medical: Not  on file  Tobacco Use  . Smoking status: Never Smoker  . Smokeless tobacco: Never Used  Substance and Sexual Activity  . Alcohol use: Not on file  . Drug use: Not on file  . Sexual activity: Not on file  Lifestyle  . Physical activity:    Days per week: Not on file    Minutes per session: Not on file  . Stress: Not on file  Relationships  . Social connections:    Talks on phone: Not on file    Gets together: Not on file    Attends religious service: Not on file    Active member of club or organization: Not on file    Attends meetings of clubs or organizations: Not on file    Relationship status: Not on file  . Intimate partner violence:    Fear of current or ex partner: Not on file    Emotionally abused: Not on file    Physically abused: Not on file    Forced sexual activity: Not on file  Other Topics Concern  . Not on file  Social History Narrative   Married.   5 children, 9 grandchildren.   Works in administration.    Enjoys watching her grandchildren, spending time with family.    Family History  Problem Relation Age of Onset  .  COPD Mother   . Diabetes Mother   . Heart disease Mother   . Diabetes Brother   . Hypertension Brother     Allergies  Allergen Reactions  . Latex Hives and Rash  . Clindamycin Rash  . Codeine Nausea Only    Other reaction(s): Other (See Comments) Sick feeling   . Quinolones Rash    Current Outpatient Medications on File Prior to Visit  Medication Sig Dispense Refill  . atorvastatin (LIPITOR) 10 MG tablet Take 1 tablet (10 mg total) by mouth every evening. 90 tablet 2  . cetirizine (ZYRTEC) 10 MG tablet Take 1 tablet (10 mg total) by mouth daily. 90 tablet 1  . gabapentin (NEURONTIN) 600 MG tablet TAKE 2 TABLETS (1,200 MG TOTAL) BY MOUTH NIGHTLY 180 tablet 1  . losartan (COZAAR) 25 MG tablet Take 1 tablet (25 mg total) by mouth daily. 90 tablet 2  . pantoprazole (PROTONIX) 20 MG tablet Take 1 tablet (20 mg total) by mouth daily.  90 tablet 1   No current facility-administered medications on file prior to visit.     BP 124/84   Pulse 97   Temp 98.3 F (36.8 C) (Oral)   Ht 5\' 5"  (1.651 m)   Wt 191 lb 4 oz (86.8 kg)   SpO2 97%   BMI 31.83 kg/m    Objective:   Physical Exam  Constitutional: She appears well-nourished.  Neck: Neck supple.  Cardiovascular: Normal rate and regular rhythm.  Respiratory: Effort normal and breath sounds normal.  Skin: Skin is warm and dry.           Assessment & Plan:

## 2017-12-08 ENCOUNTER — Other Ambulatory Visit: Payer: Self-pay | Admitting: Primary Care

## 2017-12-08 DIAGNOSIS — E785 Hyperlipidemia, unspecified: Secondary | ICD-10-CM

## 2017-12-09 NOTE — Telephone Encounter (Signed)
Last office visit 10/23/2017 for GERD.  Last refilled 03/18/2017 for #90 with 2 refills.  No Lipid Panel on file. Lipid in Care Everywhere from [facility] on 12/14/2016.   CPE scheduled for 04/25/2018. Ok to refill?

## 2017-12-09 NOTE — Telephone Encounter (Signed)
Noted. My chart message sent to patient regarding need for updated lab testing.

## 2018-01-17 ENCOUNTER — Other Ambulatory Visit: Payer: Self-pay | Admitting: Primary Care

## 2018-01-17 DIAGNOSIS — M542 Cervicalgia: Principal | ICD-10-CM

## 2018-01-17 DIAGNOSIS — G8929 Other chronic pain: Secondary | ICD-10-CM

## 2018-01-27 MED ORDER — CETIRIZINE HCL 10 MG PO TABS: 10.0000 mg | ORAL_TABLET | Freq: Every day | ORAL | 1 refills | Status: DC

## 2018-02-13 ENCOUNTER — Other Ambulatory Visit: Payer: Self-pay | Admitting: Primary Care

## 2018-02-13 DIAGNOSIS — I10 Essential (primary) hypertension: Secondary | ICD-10-CM

## 2018-03-05 ENCOUNTER — Other Ambulatory Visit: Payer: Self-pay | Admitting: Primary Care

## 2018-03-05 DIAGNOSIS — E785 Hyperlipidemia, unspecified: Secondary | ICD-10-CM

## 2018-04-21 ENCOUNTER — Other Ambulatory Visit: Payer: BC Managed Care – PPO

## 2018-04-25 ENCOUNTER — Encounter: Payer: BC Managed Care – PPO | Admitting: Primary Care

## 2018-05-28 ENCOUNTER — Other Ambulatory Visit: Payer: Self-pay | Admitting: Primary Care

## 2018-05-28 DIAGNOSIS — E785 Hyperlipidemia, unspecified: Secondary | ICD-10-CM

## 2018-07-15 ENCOUNTER — Other Ambulatory Visit: Payer: Self-pay

## 2018-07-15 ENCOUNTER — Other Ambulatory Visit (INDEPENDENT_AMBULATORY_CARE_PROVIDER_SITE_OTHER): Payer: BC Managed Care – PPO

## 2018-07-15 DIAGNOSIS — E785 Hyperlipidemia, unspecified: Secondary | ICD-10-CM

## 2018-07-15 LAB — LIPID PANEL
Cholesterol: 142 mg/dL (ref 0–200)
HDL: 37.8 mg/dL — ABNORMAL LOW (ref >39.00)
NonHDL: 104.69
Total CHOL/HDL Ratio: 4
Triglycerides: 205 mg/dL — ABNORMAL HIGH (ref 0.0–149.0)
VLDL: 41 mg/dL — ABNORMAL HIGH (ref 0.0–40.0)

## 2018-07-15 LAB — COMPREHENSIVE METABOLIC PANEL
ALT: 54 U/L — ABNORMAL HIGH (ref 0–35)
AST: 58 U/L — ABNORMAL HIGH (ref 0–37)
Albumin: 4.1 g/dL (ref 3.5–5.2)
Alkaline Phosphatase: 95 U/L (ref 39–117)
BUN: 11 mg/dL (ref 6–23)
CO2: 27 mEq/L (ref 19–32)
Calcium: 9.2 mg/dL (ref 8.4–10.5)
Chloride: 105 mEq/L (ref 96–112)
Creatinine, Ser: 1.04 mg/dL (ref 0.40–1.20)
GFR: 54.89 mL/min — ABNORMAL LOW (ref >60.00)
Glucose, Bld: 133 mg/dL — ABNORMAL HIGH (ref 70–99)
Potassium: 4 mEq/L (ref 3.5–5.1)
Sodium: 140 mEq/L (ref 135–145)
Total Bilirubin: 0.8 mg/dL (ref 0.2–1.2)
Total Protein: 6.8 g/dL (ref 6.0–8.3)

## 2018-07-15 LAB — LDL CHOLESTEROL, DIRECT: Direct LDL: 86 mg/dL

## 2018-07-16 ENCOUNTER — Encounter: Payer: Self-pay | Admitting: Primary Care

## 2018-07-16 ENCOUNTER — Ambulatory Visit: Payer: BC Managed Care – PPO | Admitting: Primary Care

## 2018-07-16 VITALS — BP 124/78 | HR 100 | Temp 98.7°F | Ht 65.0 in | Wt 194.2 lb

## 2018-07-16 DIAGNOSIS — M542 Cervicalgia: Secondary | ICD-10-CM

## 2018-07-16 DIAGNOSIS — E119 Type 2 diabetes mellitus without complications: Secondary | ICD-10-CM | POA: Diagnosis not present

## 2018-07-16 DIAGNOSIS — Z0000 Encounter for general adult medical examination without abnormal findings: Secondary | ICD-10-CM | POA: Diagnosis not present

## 2018-07-16 DIAGNOSIS — K219 Gastro-esophageal reflux disease without esophagitis: Secondary | ICD-10-CM

## 2018-07-16 DIAGNOSIS — Z23 Encounter for immunization: Secondary | ICD-10-CM

## 2018-07-16 DIAGNOSIS — N289 Disorder of kidney and ureter, unspecified: Secondary | ICD-10-CM

## 2018-07-16 DIAGNOSIS — R5383 Other fatigue: Secondary | ICD-10-CM | POA: Diagnosis not present

## 2018-07-16 DIAGNOSIS — G8929 Other chronic pain: Secondary | ICD-10-CM

## 2018-07-16 DIAGNOSIS — F419 Anxiety disorder, unspecified: Secondary | ICD-10-CM

## 2018-07-16 DIAGNOSIS — K529 Noninfective gastroenteritis and colitis, unspecified: Secondary | ICD-10-CM

## 2018-07-16 DIAGNOSIS — I10 Essential (primary) hypertension: Secondary | ICD-10-CM

## 2018-07-16 DIAGNOSIS — E785 Hyperlipidemia, unspecified: Secondary | ICD-10-CM

## 2018-07-16 DIAGNOSIS — F329 Major depressive disorder, single episode, unspecified: Secondary | ICD-10-CM

## 2018-07-16 LAB — POCT GLYCOSYLATED HEMOGLOBIN (HGB A1C): Hemoglobin A1C: 6.9 % — AB (ref 4.0–5.6)

## 2018-07-16 MED ORDER — GLIPIZIDE ER 5 MG PO TB24: 5.0000 mg | ORAL_TABLET | Freq: Every day | ORAL | 1 refills | Status: DC

## 2018-07-16 MED ORDER — GABAPENTIN 600 MG PO TABS: 600.0000 mg | ORAL_TABLET | Freq: Every day | ORAL | 1 refills | Status: DC

## 2018-07-16 MED ORDER — NORTRIPTYLINE HCL 10 MG PO CAPS: 10.0000 mg | ORAL_CAPSULE | Freq: Every day | ORAL | 0 refills | Status: DC

## 2018-07-16 NOTE — Assessment & Plan Note (Addendum)
Chronic. Recent GFR of 54, does take Ibuprofen twice weekly some weeks, some weeks will not take any.   Discussed to avoid NSAID's. Renal MRI from 2019 without masses, lesions, hydronephrosis.  Will get diabetes under better control. Continue ARB. Consider renal consultation. Repeat BMP next visit.

## 2018-07-16 NOTE — Assessment & Plan Note (Signed)
Tetanus due, provided today. Pneumonia vaccination UTD. Pap smear and mammogram UTD, following with GYN. Colonoscopy UTD. Discussed the importance of a healthy diet and regular exercise in order for weight loss, and to reduce the risk of any potential medical problems. Exam unremarkable. Labs reviewed.

## 2018-07-16 NOTE — Assessment & Plan Note (Signed)
Doing well with gabapentin and is taking 600 mg HS. Continue same.

## 2018-07-16 NOTE — Assessment & Plan Note (Signed)
Chronic for several months. Did not have time to discuss thoroughly.  Check TSH, B12, vitamin D, add CBC if possible. A1C today of 6.9 which is an increase.  Discussed to work on diet. Will gain better control of diabetes. Consider sleep apnea assessment next visit.

## 2018-07-16 NOTE — Assessment & Plan Note (Signed)
Continued despite GI work up. Will resume nortriptyline and start with 10 mg. She will update. She was once up to 50 mg.

## 2018-07-16 NOTE — Patient Instructions (Addendum)
Stop by the lab prior to leaving today. I will notify you of your results once received.   You were provided with a tetanus vaccination which will cover you for 10 years.   Follow up with your gynecologist for your annual exam and mammogram.   Start glipizide ER 5 mg tablets once daily for diabetes.  I sent your nortriptyline to the pharmacy. Take at bedtime.  It is important that you improve your diet. Please limit carbohydrates in the form of white bread, rice, pasta, sweets, fast food, fried food, sugary drinks, etc. Increase your consumption of fresh fruits and vegetables, whole grains, lean protein.  Ensure you are consuming 64 ounces of water daily.  Start exercising. You should be getting 150 minutes of moderate intensity exercise weekly.  Please schedule a follow up appointment in 3 months for diabetes check.  It was a pleasure to see you today!   Preventive Care 5-18 Years Old, Female Preventive care refers to visits with your health care provider and lifestyle choices that can promote health and wellness. This includes:  A yearly physical exam. This may also be called an annual well check.  Regular dental visits and eye exams.  Immunizations.  Screening for certain conditions.  Healthy lifestyle choices, such as eating a healthy diet, getting regular exercise, not using drugs or products that contain nicotine and tobacco, and limiting alcohol use. What can I expect for my preventive care visit? Physical exam Your health care provider will check your:  Height and weight. This may be used to calculate body mass index (BMI), which tells if you are at a healthy weight.  Heart rate and blood pressure.  Skin for abnormal spots. Counseling Your health care provider may ask you questions about your:  Alcohol, tobacco, and drug use.  Emotional well-being.  Home and relationship well-being.  Sexual activity.  Eating habits.  Work and work Statistician.  Method  of birth control.  Menstrual cycle.  Pregnancy history. What immunizations do I need?  Influenza (flu) vaccine  This is recommended every year. Tetanus, diphtheria, and pertussis (Tdap) vaccine  You may need a Td booster every 10 years. Varicella (chickenpox) vaccine  You may need this if you have not been vaccinated. Zoster (shingles) vaccine  You may need this after age 43. Measles, mumps, and rubella (MMR) vaccine  You may need at least one dose of MMR if you were born in 1957 or later. You may also need a second dose. Pneumococcal conjugate (PCV13) vaccine  You may need this if you have certain conditions and were not previously vaccinated. Pneumococcal polysaccharide (PPSV23) vaccine  You may need one or two doses if you smoke cigarettes or if you have certain conditions. Meningococcal conjugate (MenACWY) vaccine  You may need this if you have certain conditions. Hepatitis A vaccine  You may need this if you have certain conditions or if you travel or work in places where you may be exposed to hepatitis A. Hepatitis B vaccine  You may need this if you have certain conditions or if you travel or work in places where you may be exposed to hepatitis B. Haemophilus influenzae type b (Hib) vaccine  You may need this if you have certain conditions. Human papillomavirus (HPV) vaccine  If recommended by your health care provider, you may need three doses over 6 months. You may receive vaccines as individual doses or as more than one vaccine together in one shot (combination vaccines). Talk with your health care provider  about the risks and benefits of combination vaccines. What tests do I need? Blood tests  Lipid and cholesterol levels. These may be checked every 5 years, or more frequently if you are over 82 years old.  Hepatitis C test.  Hepatitis B test. Screening  Lung cancer screening. You may have this screening every year starting at age 62 if you have a  30-pack-year history of smoking and currently smoke or have quit within the past 15 years.  Colorectal cancer screening. All adults should have this screening starting at age 50 and continuing until age 41. Your health care provider may recommend screening at age 67 if you are at increased risk. You will have tests every 1-10 years, depending on your results and the type of screening test.  Diabetes screening. This is done by checking your blood sugar (glucose) after you have not eaten for a while (fasting). You may have this done every 1-3 years.  Mammogram. This may be done every 1-2 years. Talk with your health care provider about when you should start having regular mammograms. This may depend on whether you have a family history of breast cancer.  BRCA-related cancer screening. This may be done if you have a family history of breast, ovarian, tubal, or peritoneal cancers.  Pelvic exam and Pap test. This may be done every 3 years starting at age 35. Starting at age 29, this may be done every 5 years if you have a Pap test in combination with an HPV test. Other tests  Sexually transmitted disease (STD) testing.  Bone density scan. This is done to screen for osteoporosis. You may have this scan if you are at high risk for osteoporosis. Follow these instructions at home: Eating and drinking  Eat a diet that includes fresh fruits and vegetables, whole grains, lean protein, and low-fat dairy.  Take vitamin and mineral supplements as recommended by your health care provider.  Do not drink alcohol if: ? Your health care provider tells you not to drink. ? You are pregnant, may be pregnant, or are planning to become pregnant.  If you drink alcohol: ? Limit how much you have to 0-1 drink a day. ? Be aware of how much alcohol is in your drink. In the [place] one drink equals one 12 oz bottle of beer (355 mL), one 5 oz glass of wine (148 mL), or one 1 oz glass of hard liquor (44  mL). Lifestyle  Take daily care of your teeth and gums.  Stay active. Exercise for at least 30 minutes on 5 or more days each week.  Do not use any products that contain nicotine or tobacco, such as cigarettes, e-cigarettes, and chewing tobacco. If you need help quitting, ask your health care provider.  If you are sexually active, practice safe sex. Use a condom or other form of birth control (contraception) in order to prevent pregnancy and STIs (sexually transmitted infections).  If told by your health care provider, take low-dose aspirin daily starting at age 35. What's next?  Visit your health care provider once a year for a well check visit.  Ask your health care provider how often you should have your eyes and teeth checked.  Stay up to date on all vaccines. This information is not intended to replace advice given to you by your health care provider. Make sure you discuss any questions you have with your health care provider. Document Released: 01/21/2015 Document Revised: 09/05/2017 Document Reviewed: 09/05/2017 Elsevier Patient Education  2020  [employer].

## 2018-07-16 NOTE — Assessment & Plan Note (Signed)
LDL at goal, continue atorvastatin.  

## 2018-07-16 NOTE — Progress Notes (Signed)
Subjective:    Patient ID: Cheyenne HoffmannKristina Flowers, female    DOB: [birth year], 58 y.o.   MRN: [id number]  HPI  Cheyenne Flowers is a 58 year old female who presents today for complete physical.  She would also like to resume her nortriptyline for which she was once taking for chronic diarrhea. She's undergone GI evaluation including colonoscopy and has been taking Imodium frequently. She felt better when taking her nortriptyline.    Also with reports of chronic fatigue for the last three months. Feels little energy overall. Has been eating a poor diet and is not exercising.   Immunizations: -Tetanus: Unsure, believes it's been over 10 years  -Influenza: Due this season  -Pneumonia: Completed in 2019  Diet: She endorses a poor diet. She is eating out one meal per day. Snacks on string cheese. Sometimes cereal for breakfast. Sandwiches for lunch usually. She is eating desserts daily. She drinks Kool-Aid mostly, diet soda, little water.  Exercise: She is not exercising.  Eye exam: Completed in June 2020 Dental exam: Completes semi-annually Colonoscopy: Completed in 2019 Pap Smear: UTD. Follows with GYN.  Mammogram: Completed in July 2019  BP Readings from Last 3 Encounters:  07/16/18 124/78  10/23/17 124/84  05/16/17 122/76     Review of Systems  Constitutional: Negative for unexpected weight change.  HENT: Negative for rhinorrhea.   Respiratory: Negative for cough and shortness of breath.   Cardiovascular: Negative for chest pain.  Gastrointestinal: Negative for constipation.       Chronic diarrhea   Genitourinary: Negative for difficulty urinating.  Musculoskeletal: Positive for arthralgias.       Chronic knee pain  Skin: Negative for rash.  Allergic/Immunologic: Negative for environmental allergies.  Neurological: Negative for dizziness, numbness and headaches.  Psychiatric/Behavioral: The patient is not nervous/anxious.        Past Medical History:  Diagnosis Date  . Allergy    . Anxiety and depression   . Arthritis   . Chronic neck pain   . Diverticulosis   . GERD (gastroesophageal reflux disease)   . Hyperlipidemia   . Hypertension   . 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 Type 2 diabetes mellitus (HCC)      Social History   Socioeconomic History  . Marital status: Married    Spouse name: Not on file  . Number of children: Not on file  . Years of education: Not on file  . Highest education level: Not on file  Occupational History  . Not on file  Social Needs  . Financial resource strain: Not on file  . Food insecurity    Worry: Not on file    Inability: Not on file  . Transportation needs    Medical: Not on file    Non-medical: Not on file  Tobacco Use  . Smoking status: Never Smoker  . Smokeless tobacco: Never Used  Substance and Sexual Activity  . Alcohol use: Not on file  . Drug use: Not on file  . Sexual activity: Not on file  Lifestyle  . Physical activity    Days per week: Not on file    Minutes per session: Not on file  . Stress: Not on file  Relationships  . Social Musicianconnections    Talks on phone: Not on file    Gets together: Not on file    Attends religious service: Not on file    Active member of club or organization: Not on file    Attends meetings of clubs  or organizations: Not on file    Relationship status: Not on file  . Intimate partner violence    Fear of current or ex partner: Not on file    Emotionally abused: Not on file    Physically abused: Not on file    Forced sexual activity: Not on file  Other Topics Concern  . Not on file  Social History Narrative   Married.   5 children, 9 grandchildren.   Works in administration.    Enjoys watching her grandchildren, spending time with family.    Past Surgical History:  Procedure Laterality Date  . DG GALL BLADDER  1990    Family History  Problem Relation Age of Onset  . COPD Mother   . Diabetes Mother   . Heart disease Mother   . Diabetes Brother   .  Hypertension Brother     Allergies  Allergen Reactions  . Latex Hives and Rash  . Clindamycin Rash  . Codeine Nausea Only    Other reaction(s): Other (See Comments) Sick feeling   . Quinolones Rash    Current Outpatient Medications on File Prior to Visit  Medication Sig Dispense Refill  . atorvastatin (LIPITOR) 10 MG tablet Take 1 tablet (10 mg total) by mouth every evening. For cholesterol. NEED APPOINTMENT FOR ANY MORE REFILLS 90 tablet 0  . cetirizine (ZYRTEC) 10 MG tablet Take 1 tablet (10 mg total) by mouth daily. 90 tablet 1  . losartan (COZAAR) 25 MG tablet TAKE 1 TABLET BY MOUTH EVERY DAY 90 tablet 1  . pantoprazole (PROTONIX) 20 MG tablet Take 1 tablet (20 mg total) by mouth daily. 90 tablet 1   No current facility-administered medications on file prior to visit.     BP 124/78   Pulse 100   Temp 98.7 F (37.1 C) (Temporal)   Ht 5\' 5"  (1.651 m)   Wt 194 lb 4 oz (88.1 kg)   SpO2 98%   BMI 32.32 kg/m    Objective:   Physical Exam  Constitutional: She is oriented to person, place, and time. She appears well-nourished.  HENT:  Mouth/Throat: No oropharyngeal exudate.  Eyes: Pupils are equal, round, and reactive to light. EOM are normal.  Neck: Neck supple. No thyromegaly present.  Cardiovascular: Normal rate and regular rhythm.  Respiratory: Effort normal and breath sounds normal.  GI: Soft. Bowel sounds are normal. There is no abdominal tenderness.  Musculoskeletal: Normal range of motion.  Neurological: She is alert and oriented to person, place, and time.  Skin: Skin is warm and dry.  Psychiatric: She has a normal mood and affect.           Assessment & Plan:

## 2018-07-16 NOTE — Assessment & Plan Note (Signed)
Increase in A1C from 6.3 to 6.9. This is likely due to her poor diet and lack of exercise.  Will avoid Metformin given diarrhea. Start glipizide ER 5 mg daily. Strongly advised her to work on diet and start exercising.   Pneumonia vaccination UTD. Foot exam today. Eye exam UTD. Managed on statin and ARB.  Follow up in 3 months.

## 2018-07-16 NOTE — Assessment & Plan Note (Signed)
Stable in the office today, BMP with reduction in renal function. This could be due to return of diabetes, also taking NSAID's.   Continue losartan 25 mg.

## 2018-07-16 NOTE — Assessment & Plan Note (Signed)
No concerns today. Continue to monitor.  Re-initiated nortriptyline HS for chronic diarrhea.

## 2018-07-16 NOTE — Assessment & Plan Note (Signed)
Doing well on pantoprazole for which she takes 3-4 times weekly. Continue same. Discussed to avoid triggers.

## 2018-07-17 DIAGNOSIS — E559 Vitamin D deficiency, unspecified: Secondary | ICD-10-CM

## 2018-07-17 LAB — VITAMIN D 25 HYDROXY (VIT D DEFICIENCY, FRACTURES): VITD: 21.25 ng/mL — ABNORMAL LOW (ref 30.00–100.00)

## 2018-07-17 LAB — VITAMIN B12: Vitamin B-12: 367 pg/mL (ref 211–911)

## 2018-07-17 LAB — TSH: TSH: 1.75 u[IU]/mL (ref 0.35–4.50)

## 2018-07-17 MED ORDER — VITAMIN D (ERGOCALCIFEROL) 1.25 MG (50000 UNIT) PO CAPS: ORAL_CAPSULE | ORAL | 0 refills | Status: DC

## 2018-07-17 NOTE — Addendum Note (Signed)
Addended by: Jacqualin Combes on: 07/17/2018 07:47 AM   Modules accepted: Orders

## 2018-07-28 ENCOUNTER — Other Ambulatory Visit: Payer: Self-pay

## 2018-07-28 DIAGNOSIS — E785 Hyperlipidemia, unspecified: Secondary | ICD-10-CM

## 2018-07-28 MED ORDER — ATORVASTATIN CALCIUM 10 MG PO TABS: 10.0000 mg | ORAL_TABLET | Freq: Every evening | ORAL | 2 refills | Status: DC

## 2018-09-26 ENCOUNTER — Other Ambulatory Visit: Payer: Self-pay | Admitting: Primary Care

## 2018-09-26 DIAGNOSIS — I10 Essential (primary) hypertension: Secondary | ICD-10-CM

## 2018-09-30 ENCOUNTER — Other Ambulatory Visit: Payer: Self-pay | Admitting: Primary Care

## 2018-09-30 DIAGNOSIS — E559 Vitamin D deficiency, unspecified: Secondary | ICD-10-CM

## 2018-09-30 DIAGNOSIS — K529 Noninfective gastroenteritis and colitis, unspecified: Secondary | ICD-10-CM

## 2018-10-04 ENCOUNTER — Other Ambulatory Visit: Payer: Self-pay | Admitting: Primary Care

## 2018-10-04 DIAGNOSIS — E119 Type 2 diabetes mellitus without complications: Secondary | ICD-10-CM

## 2018-10-16 ENCOUNTER — Other Ambulatory Visit: Payer: Self-pay | Admitting: Primary Care

## 2018-10-16 DIAGNOSIS — E785 Hyperlipidemia, unspecified: Secondary | ICD-10-CM

## 2018-10-17 ENCOUNTER — Encounter: Payer: Self-pay | Admitting: Primary Care

## 2018-10-17 ENCOUNTER — Other Ambulatory Visit: Payer: Self-pay

## 2018-10-17 ENCOUNTER — Ambulatory Visit (INDEPENDENT_AMBULATORY_CARE_PROVIDER_SITE_OTHER): Payer: BC Managed Care – PPO | Admitting: Primary Care

## 2018-10-17 VITALS — BP 126/76 | HR 102 | Temp 97.6°F | Ht 65.0 in | Wt 192.5 lb

## 2018-10-17 DIAGNOSIS — E119 Type 2 diabetes mellitus without complications: Secondary | ICD-10-CM | POA: Diagnosis not present

## 2018-10-17 DIAGNOSIS — Z1159 Encounter for screening for other viral diseases: Secondary | ICD-10-CM

## 2018-10-17 DIAGNOSIS — E559 Vitamin D deficiency, unspecified: Secondary | ICD-10-CM

## 2018-10-17 LAB — POCT GLYCOSYLATED HEMOGLOBIN (HGB A1C): Hemoglobin A1C: 6.1 % — AB (ref 4.0–5.6)

## 2018-10-17 NOTE — Assessment & Plan Note (Signed)
Repeat Vitamin D pending. Diarrhea has improved since on vitamin D 50,000 units weekly.

## 2018-10-17 NOTE — Patient Instructions (Signed)
Stop by the lab prior to leaving today. I will notify you of your results once received.   Continue taking Glipizide 5 mg BID for diabetes.  Please schedule a follow up appointment in 6 months for follow up of diabetes.   It was a pleasure to see you today!

## 2018-10-17 NOTE — Progress Notes (Signed)
Subjective:    Patient ID: Cheyenne Flowers, female    DOB: February 05, 1960, 58 y.o.   MRN: [id number]  HPI  Cheyenne Flowers is a 58 year old female who presents today for follow up of type 2 diabetes.  Current medications include: Glipizide 5 mg BID  Last A1C: 6.9 in July 2020 Last Eye Exam: Completed in June 2020 Last Foot Exam: Completed in July 2020 Pneumonia Vaccination: Completed in 2019 ACE/ARB: Losartan  Statin: Lipitor   BP Readings from Last 3 Encounters:  10/17/18 126/76  07/16/18 124/78  10/23/17 124/84   She denies dizziness, chest pain, numbness/tingling.   Review of Systems  Eyes: Negative for visual disturbance.  Respiratory: Negative for shortness of breath.   Cardiovascular: Negative for chest pain.  Neurological: Negative for dizziness.       Past Medical History:  Diagnosis Date  . Allergy   . Anxiety and depression   . Arthritis   . Chronic neck pain   . Diverticulosis   . GERD (gastroesophageal reflux disease)   . Hyperlipidemia   . Hypertension   . 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[place])   . Type 2 diabetes mellitus ([place])      Social History   Socioeconomic History  . Marital status: Married    Spouse name: Not on file  . Number of children: Not on file  . Years of education: Not on file  . Highest education level: Not on file  Occupational History  . Not on file  Social Needs  . Financial resource strain: Not on file  . Food insecurity    Worry: Not on file    Inability: Not on file  . Transportation needs    Medical: Not on file    Non-medical: Not on file  Tobacco Use  . Smoking status: Never Smoker  . Smokeless tobacco: Never Used  Substance and Sexual Activity  . Alcohol use: Not on file  . Drug use: Not on file  . Sexual activity: Not on file  Lifestyle  . Physical activity    Days per week: Not on file    Minutes per session: Not on file  . Stress: Not on file  Relationships  . Social Herbalist on phone: Not on file   Gets together: Not on file    Attends religious service: Not on file    Active member of club or organization: Not on file    Attends meetings of clubs or organizations: Not on file    Relationship status: Not on file  . Intimate partner violence    Fear of current or ex partner: Not on file    Emotionally abused: Not on file    Physically abused: Not on file    Forced sexual activity: Not on file  Other Topics Concern  . Not on file  Social History Narrative   Married.   5 children, 9 grandchildren.   Works in administration.    Enjoys watching her grandchildren, spending time with family.    Past Surgical History:  Procedure Laterality Date  . DG GALL BLADDER  1990    Family History  Problem Relation Age of Onset  . COPD Mother   . Diabetes Mother   . Heart disease Mother   . Diabetes Brother   . Hypertension Brother     Allergies  Allergen Reactions  . Latex Hives and Rash  . Clindamycin Rash  . Codeine Nausea Only    Other reaction(s): Other (See  Comments) Sick feeling   . Quinolones Rash    Current Outpatient Medications on File Prior to Visit  Medication Sig Dispense Refill  . atorvastatin (LIPITOR) 10 MG tablet Take 1 tablet (10 mg total) by mouth every evening. For cholesterol 90 tablet 2  . cetirizine (ZYRTEC) 10 MG tablet Take 1 tablet (10 mg total) by mouth daily. 90 tablet 1  . gabapentin (NEURONTIN) 600 MG tablet Take 1 tablet (600 mg total) by mouth at bedtime. 90 tablet 1  . glipiZIDE (GLUCOTROL) 5 MG tablet Take 1 tablet (5 mg total) by mouth 2 (two) times daily before a meal. For diabetes. 180 tablet 1  . losartan (COZAAR) 25 MG tablet TAKE 1 TABLET BY MOUTH EVERY DAY 90 tablet 1  . nortriptyline (PAMELOR) 10 MG capsule TAKE 1 CAPSULE (10 MG TOTAL) BY MOUTH AT BEDTIME. FOR DIARRHEA. 90 capsule 0  . pantoprazole (PROTONIX) 20 MG tablet Take 1 tablet (20 mg total) by mouth daily. 90 tablet 1  . Vitamin D, Ergocalciferol, (DRISDOL) 1.25 MG (50000 UT)  CAPS capsule TAKE 1 CAPSULE BY MOUTH ONCE WEEKLY FOR 12 WEEKS. 12 capsule 0   No current facility-administered medications on file prior to visit.     BP 126/76   Pulse (!) 102   Temp 97.6 F (36.4 C) (Temporal)   Ht 5\' 5"  (1.651 m)   Wt 192 lb 8 oz (87.3 kg)   SpO2 98%   BMI 32.03 kg/m    Objective:   Physical Exam  Constitutional: She appears well-nourished.  Neck: Neck supple.  Cardiovascular: Normal rate and regular rhythm.  Respiratory: Effort normal and breath sounds normal.  Skin: Skin is warm and dry.           Assessment & Plan:

## 2018-10-17 NOTE — Assessment & Plan Note (Signed)
A1C in the office today of 6.1 which is an improvement from last visit.  Continue Glipizide 5 mg BID. Managed on ARB and statin. Pneumonia vaccination UTD. Eye exam UTD.  Follow up in 6 months.

## 2018-10-20 LAB — VITAMIN D 25 HYDROXY (VIT D DEFICIENCY, FRACTURES): Vit D, 25-Hydroxy: 39 ng/mL (ref 30–100)

## 2018-10-20 LAB — HEPATITIS C ANTIBODY
Hepatitis C Ab: NONREACTIVE
SIGNAL TO CUT-OFF: 0.05 (ref <1.00)

## 2018-12-15 DIAGNOSIS — E119 Type 2 diabetes mellitus without complications: Secondary | ICD-10-CM

## 2018-12-15 MED ORDER — GLIPIZIDE ER 5 MG PO TB24: 5.0000 mg | ORAL_TABLET | Freq: Every day | ORAL | 3 refills | Status: DC

## 2018-12-23 ENCOUNTER — Other Ambulatory Visit: Payer: Self-pay | Admitting: Primary Care

## 2018-12-23 DIAGNOSIS — E559 Vitamin D deficiency, unspecified: Secondary | ICD-10-CM

## 2018-12-23 NOTE — Telephone Encounter (Signed)
Noted, refill sent to pharmacy. 

## 2018-12-23 NOTE — Telephone Encounter (Signed)
Last prescribed on 10/01/2018. Last appointment on 10/14/2018. No future appointment

## 2018-12-24 ENCOUNTER — Other Ambulatory Visit: Payer: Self-pay | Admitting: Primary Care

## 2018-12-24 DIAGNOSIS — K529 Noninfective gastroenteritis and colitis, unspecified: Secondary | ICD-10-CM

## 2019-01-16 ENCOUNTER — Other Ambulatory Visit: Payer: Self-pay | Admitting: Primary Care

## 2019-01-19 NOTE — Telephone Encounter (Signed)
Patient left a voicemail regarding this mychart message that she sent. Please review.

## 2019-01-19 NOTE — Telephone Encounter (Signed)
After speaking with Deboraha Sprang NP, patient was called and advised that Jae Dire has left for the day. Advised patent if she is having ear pain and can not wait until an appointment tomorrow she should go to an Urgent Care. Patient stated that it is a little achy, but it can wait until tomorrow. Patient scheduled for an appointment tomorrow with Mayra Reel NP.

## 2019-01-20 ENCOUNTER — Ambulatory Visit: Payer: BC Managed Care – PPO | Admitting: Primary Care

## 2019-01-20 ENCOUNTER — Encounter: Payer: Self-pay | Admitting: Primary Care

## 2019-01-20 ENCOUNTER — Other Ambulatory Visit: Payer: Self-pay

## 2019-01-20 VITALS — BP 136/86 | HR 117 | Temp 97.5°F | Ht 65.0 in | Wt 196.8 lb

## 2019-01-20 DIAGNOSIS — T161XXA Foreign body in right ear, initial encounter: Secondary | ICD-10-CM

## 2019-01-20 DIAGNOSIS — S01311A Laceration without foreign body of right ear, initial encounter: Secondary | ICD-10-CM | POA: Insufficient documentation

## 2019-01-20 HISTORY — DX: Foreign body in right ear, initial encounter: T16.1XXA

## 2019-01-20 HISTORY — DX: Laceration without foreign body of right ear, initial encounter: S01.311A

## 2019-01-20 NOTE — Telephone Encounter (Signed)
Patient evaluated.  

## 2019-01-20 NOTE — Progress Notes (Signed)
Subjective:    Patient ID: Cheyenne Flowers, female    DOB: 12-16-1960, 59 y.o.   MRN: [id number]  HPI  This visit occurred during the SARS-CoV-2 public health emergency.  Safety protocols were in place, including screening questions prior to the visit, additional usage of staff PPE, and extensive cleaning of exam room while observing appropriate contact time as indicated for disinfecting solutions.   Cheyenne Flowers is a 59 year old female with a history of type 2 diabetes, GERD, hypertension, chronic neck pain, hyperlipidemia who presents today with a chief complaint of   She was using a q-tip to her right ear yesterday morning as she typically does due to itching, heard a noise and noticed that the q-tip didn't have cotton the tip that was once there. Since then she's noticed some aching/discomfort.   She denies fevers, discomfort to the left ear.  Review of Systems  Constitutional: Negative for fever.  HENT: Negative for sore throat.        Ear discomfort       Past Medical History:  Diagnosis Date  . Allergy   . Anxiety and depression   . Arthritis   . Chronic neck pain   . Diverticulosis   . GERD (gastroesophageal reflux disease)   . Hyperlipidemia   . Hypertension   . 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[place])   . Type 2 diabetes mellitus ([place])      Social History   Socioeconomic History  . Marital status: Married    Spouse name: Not on file  . Number of children: Not on file  . Years of education: Not on file  . Highest education level: Not on file  Occupational History  . Not on file  Tobacco Use  . Smoking status: Never Smoker  . Smokeless tobacco: Never Used  Substance and Sexual Activity  . Alcohol use: Not on file  . Drug use: Not on file  . Sexual activity: Not on file  Other Topics Concern  . Not on file  Social History Narrative   Married.   5 children, 9 grandchildren.   Works in administration.    Enjoys watching her grandchildren, spending time with family.    Social Determinants of Health   Financial Resource Strain:   . Difficulty of Paying Living Expenses: Not on file  Food Insecurity:   . Worried About Charity fundraiser in the Last Year: Not on file  . Ran Out of Food in the Last Year: Not on file  Transportation Needs:   . Lack of Transportation (Medical): Not on file  . Lack of Transportation (Non-Medical): Not on file  Physical Activity:   . Days of Exercise per Week: Not on file  . Minutes of Exercise per Session: Not on file  Stress:   . Feeling of Stress : Not on file  Social Connections:   . Frequency of Communication with Friends and Family: Not on file  . Frequency of Social Gatherings with Friends and Family: Not on file  . Attends Religious Services: Not on file  . Active Member of Clubs or Organizations: Not on file  . Attends Archivist Meetings: Not on file  . Marital Status: Not on file  Intimate Partner Violence:   . Fear of Current or Ex-Partner: Not on file  . Emotionally Abused: Not on file  . Physically Abused: Not on file  . Sexually Abused: Not on file    Past Surgical History:  Procedure Laterality Date  .  DG GALL BLADDER  1990    Family History  Problem Relation Age of Onset  . COPD Mother   . Diabetes Mother   . Heart disease Mother   . Diabetes Brother   . Hypertension Brother     Allergies  Allergen Reactions  . Latex Hives and Rash  . Clindamycin Rash  . Codeine Nausea Only    Other reaction(s): Other (See Comments) Sick feeling   . Quinolones Rash    Current Outpatient Medications on File Prior to Visit  Medication Sig Dispense Refill  . atorvastatin (LIPITOR) 10 MG tablet Take 1 tablet (10 mg total) by mouth every evening. 90 tablet 2  . cetirizine (ZYRTEC) 10 MG tablet TAKE 1 TABLET BY MOUTH EVERY DAY 90 tablet 1  . gabapentin (NEURONTIN) 600 MG tablet Take 1 tablet (600 mg total) by mouth at bedtime. 90 tablet 1  . glipiZIDE (GLUCOTROL XL) 5 MG 24 hr tablet Take  1 tablet (5 mg total) by mouth daily with breakfast. For diabetes. 90 tablet 3  . losartan (COZAAR) 25 MG tablet TAKE 1 TABLET BY MOUTH EVERY DAY 90 tablet 1  . nortriptyline (PAMELOR) 10 MG capsule TAKE 1 CAPSULE (10 MG TOTAL) BY MOUTH AT BEDTIME. FOR DIARRHEA. 90 capsule 0  . pantoprazole (PROTONIX) 20 MG tablet Take 1 tablet (20 mg total) by mouth daily. 90 tablet 1  . Vitamin D, Ergocalciferol, (DRISDOL) 1.25 MG (50000 UT) CAPS capsule TAKE 1 CAPSULE BY MOUTH ONCE WEEKLY FOR 12 WEEKS. 12 capsule 0   No current facility-administered medications on file prior to visit.    BP 136/86   Pulse (!) 117   Temp (!) 97.5 F (36.4 C) (Temporal)   Ht 5\' 5"  (1.651 m)   Wt 196 lb 12 oz (89.2 kg)   SpO2 100%   BMI 32.74 kg/m    Objective:   Physical Exam  Constitutional: She appears well-nourished.  HENT:  Left Ear: Tympanic membrane and ear canal normal.  Remains of cotton portion of q-tip deeply located to right canal. Also with flaking skin to right canal.  Respiratory: Effort normal.  Skin: Skin is warm and dry.           Assessment & Plan:

## 2019-01-20 NOTE — Patient Instructions (Signed)
Stop by the front desk and speak with either Shirlee Limerick or Charmaine regarding your referral to ENT.  It was a pleasure to see you today!

## 2019-01-20 NOTE — Assessment & Plan Note (Signed)
Evidence of cotton portion of q tip in right canal, unsuccessful irritation and instrumentation removal today.   Patient consented to irrigation and instrumentation for removal which was unsuccessful. She did have mild irritation with minimal bleeding to right canal, TM intact. She tolerated okay.  Referral placed to ENT for removal of foreign body.

## 2019-02-13 ENCOUNTER — Ambulatory Visit: Payer: BC Managed Care – PPO | Admitting: Primary Care

## 2019-02-13 ENCOUNTER — Ambulatory Visit (INDEPENDENT_AMBULATORY_CARE_PROVIDER_SITE_OTHER)
Admission: RE | Admit: 2019-02-13 | Discharge: 2019-02-13 | Disposition: A | Discharge status: Home / Self Care | Payer: BC Managed Care – PPO | Source: Ambulatory Visit | Attending: Primary Care | Admitting: Primary Care

## 2019-02-13 ENCOUNTER — Encounter: Payer: Self-pay | Admitting: Primary Care

## 2019-02-13 ENCOUNTER — Other Ambulatory Visit: Payer: Self-pay

## 2019-02-13 DIAGNOSIS — G8929 Other chronic pain: Secondary | ICD-10-CM | POA: Diagnosis not present

## 2019-02-13 DIAGNOSIS — M25551 Pain in right hip: Secondary | ICD-10-CM | POA: Diagnosis not present

## 2019-02-13 MED ORDER — CYCLOBENZAPRINE HCL 5 MG PO TABS: 5.0000 mg | ORAL_TABLET | Freq: Three times a day (TID) | ORAL | 0 refills | Status: DC | PRN

## 2019-02-13 NOTE — Patient Instructions (Signed)
Complete xray(s) prior to leaving today. I will notify you of your results once received.  You can take Ibuprofen 600-800 mg every 8 hours as needed for pain.  You can take the cyclobenzaprine at bedtime or up to three times daily as needed for muscle tightness.  It was a pleasure to see you today!

## 2019-02-13 NOTE — Assessment & Plan Note (Addendum)
Chronic and intermittent for years, worse and daily for several months. Exam today with decrease in ROM due to pain.  Plain films pending. Rx for cyclobenzaprine to use HS and PRN. Continue Ibuprofen.  Recommended PT, she will think about this.

## 2019-02-13 NOTE — Progress Notes (Signed)
Subjective:    Patient ID: Cheyenne Flowers, female    DOB: September 30, 1960, 59 y.o.   MRN: [id number]  HPI  This visit occurred during the SARS-CoV-2 public health emergency.  Safety protocols were in place, including screening questions prior to the visit, additional usage of staff PPE, and extensive cleaning of exam room while observing appropriate contact time as indicated for disinfecting solutions.   Cheyenne Flowers is a 59 year old female with a history of type 2 diabetes, GERD, chronic diarrhea, hypertension, anxiety and depression who presents today with a chief complaint of hip pain.  She has a history of chronic right hip pain intermittently for the last two years, daily over the last several months. Also with right groin pina. She notices her pain after rising after a seated position or when trying to sit after walking for prolonged periods of time. She describes her symptoms as achy, stiff, throbbing.   She's been taking ibuprofen with temporary improvement. Sometimes she feels as though her right lower extremity will "give out". She denies recent injury/trauma, color changes to skin, numbness/tingling.   Wt Readings from Last 3 Encounters:  02/13/19 198 lb 8 oz (90 kg)  01/20/19 196 lb 12 oz (89.2 kg)  10/17/18 192 lb 8 oz (87.3 kg)    BP Readings from Last 3 Encounters:  02/13/19 126/86  01/20/19 136/86  10/17/18 126/76     Review of Systems  Musculoskeletal: Positive for arthralgias.  Skin: Negative for color change.  Neurological: Positive for weakness. Negative for numbness.       Past Medical History:  Diagnosis Date  . Allergy   . Anxiety and depression   . Arthritis   . Chronic neck pain   . Diverticulosis   . GERD (gastroesophageal reflux disease)   . Hyperlipidemia   . Hypertension   . 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 Type 2 diabetes mellitus ([place])      Social History   Socioeconomic History  . Marital status: Married    Spouse name: Not on file  . Number  of children: Not on file  . Years of education: Not on file  . Highest education level: Not on file  Occupational History  . Not on file  Tobacco Use  . Smoking status: Never Smoker  . Smokeless tobacco: Never Used  Substance and Sexual Activity  . Alcohol use: Not on file  . Drug use: Not on file  . Sexual activity: Not on file  Other Topics Concern  . Not on file  Social History Narrative   Married.   5 children, 9 grandchildren.   Works in administration.    Enjoys watching her grandchildren, spending time with family.   Social Determinants of Health   Financial Resource Strain:   . Difficulty of Paying Living Expenses: Not on file  Food Insecurity:   . Worried About Charity fundraiser in the Last Year: Not on file  . Ran Out of Food in the Last Year: Not on file  Transportation Needs:   . Lack of Transportation (Medical): Not on file  . Lack of Transportation (Non-Medical): Not on file  Physical Activity:   . Days of Exercise per Week: Not on file  . Minutes of Exercise per Session: Not on file  Stress:   . Feeling of Stress : Not on file  Social Connections:   . Frequency of Communication with Friends and Family: Not on file  . Frequency of Social Gatherings with Friends  and Family: Not on file  . Attends Religious Services: Not on file  . Active Member of Clubs or Organizations: Not on file  . Attends Banker Meetings: Not on file  . Marital Status: Not on file  Intimate Partner Violence:   . Fear of Current or Ex-Partner: Not on file  . Emotionally Abused: Not on file  . Physically Abused: Not on file  . Sexually Abused: Not on file    Past Surgical History:  Procedure Laterality Date  . DG GALL BLADDER  1990    Family History  Problem Relation Age of Onset  . COPD Mother   . Diabetes Mother   . Heart disease Mother   . Diabetes Brother   . Hypertension Brother     Allergies  Allergen Reactions  . Latex Hives and Rash  .  Clindamycin Rash  . Codeine Nausea Only    Other reaction(s): Other (See Comments) Sick feeling   . Quinolones Rash    Current Outpatient Medications on File Prior to Visit  Medication Sig Dispense Refill  . atorvastatin (LIPITOR) 10 MG tablet Take 1 tablet (10 mg total) by mouth every evening. 90 tablet 2  . cetirizine (ZYRTEC) 10 MG tablet TAKE 1 TABLET BY MOUTH EVERY DAY 90 tablet 1  . gabapentin (NEURONTIN) 600 MG tablet Take 1 tablet (600 mg total) by mouth at bedtime. 90 tablet 1  . glipiZIDE (GLUCOTROL XL) 5 MG 24 hr tablet Take 1 tablet (5 mg total) by mouth daily with breakfast. For diabetes. 90 tablet 3  . losartan (COZAAR) 25 MG tablet TAKE 1 TABLET BY MOUTH EVERY DAY 90 tablet 1  . nortriptyline (PAMELOR) 10 MG capsule TAKE 1 CAPSULE (10 MG TOTAL) BY MOUTH AT BEDTIME. FOR DIARRHEA. 90 capsule 0  . pantoprazole (PROTONIX) 20 MG tablet Take 1 tablet (20 mg total) by mouth daily. 90 tablet 1  . Vitamin D, Ergocalciferol, (DRISDOL) 1.25 MG (50000 UT) CAPS capsule TAKE 1 CAPSULE BY MOUTH ONCE WEEKLY FOR 12 WEEKS. 12 capsule 0   No current facility-administered medications on file prior to visit.    BP 126/86   Pulse (!) 124   Temp 97.8 F (36.6 C) (Temporal)   Ht 5\' 5"  (1.651 m)   Wt 198 lb 8 oz (90 kg)   SpO2 98%   BMI 33.03 kg/m    Objective:   Physical Exam  Constitutional: She appears well-nourished.  Cardiovascular: Normal rate and regular rhythm.  Respiratory: Effort normal and breath sounds normal.  Musculoskeletal:     Cervical back: Neck supple.     Right hip: No deformity. Decreased range of motion. Normal strength.       Legs:     Comments: Pain to right hip with flexion, internal and external rotation while supine.   Skin: Skin is warm and dry.  Psychiatric: She has a normal mood and affect.           Assessment & Plan:

## 2019-02-16 ENCOUNTER — Other Ambulatory Visit: Payer: Self-pay | Admitting: Primary Care

## 2019-02-16 DIAGNOSIS — M25562 Pain in left knee: Secondary | ICD-10-CM

## 2019-02-16 DIAGNOSIS — G8929 Other chronic pain: Secondary | ICD-10-CM

## 2019-02-16 DIAGNOSIS — M25551 Pain in right hip: Secondary | ICD-10-CM

## 2019-03-16 ENCOUNTER — Other Ambulatory Visit: Payer: Self-pay | Admitting: Primary Care

## 2019-03-16 DIAGNOSIS — E559 Vitamin D deficiency, unspecified: Secondary | ICD-10-CM

## 2019-03-17 ENCOUNTER — Encounter: Payer: Self-pay | Admitting: Primary Care

## 2019-03-17 ENCOUNTER — Ambulatory Visit: Payer: BC Managed Care – PPO | Admitting: Primary Care

## 2019-03-17 ENCOUNTER — Other Ambulatory Visit: Payer: Self-pay

## 2019-03-17 VITALS — BP 124/84 | HR 107 | Temp 97.5°F | Ht 65.0 in | Wt 198.5 lb

## 2019-03-17 DIAGNOSIS — L02214 Cutaneous abscess of groin: Secondary | ICD-10-CM | POA: Diagnosis not present

## 2019-03-17 MED ORDER — SULFAMETHOXAZOLE-TRIMETHOPRIM 800-160 MG PO TABS: 1.0000 | ORAL_TABLET | Freq: Two times a day (BID) | ORAL | 0 refills | Status: DC

## 2019-03-17 NOTE — Telephone Encounter (Signed)
Last prescribed on 12/23/2018 . Last appointment on 02/13/2019 (acute) . Next future appointment on 03/17/2019

## 2019-03-17 NOTE — Assessment & Plan Note (Signed)
Appears to be healing but there is evidence of mass subcutaneously. Given her history of diabetes, coupled with the two month abscess history, we will treat with oral antibiotics.  Rx for Bactrim DS course sent to pharmacy. Return precautions provided.

## 2019-03-17 NOTE — Patient Instructions (Signed)
Start Bactrim DS (sulfamethoxazole/trimethoprim) tablets for the infection. Take 1 tablet by mouth twice daily for 5 days.  Please notify me if you start to notice a return in symptoms.  It was a pleasure to see you today!

## 2019-03-17 NOTE — Progress Notes (Signed)
Subjective:    Patient ID: Cheyenne Flowers, female    DOB: 09/03/1960, 59 y.o.   MRN: [id number]  HPI  This visit occurred during the SARS-CoV-2 public health emergency.  Safety protocols were in place, including screening questions prior to the visit, additional usage of staff PPE, and extensive cleaning of exam room while observing appropriate contact time as indicated for disinfecting solutions.   Cheyenne Flowers is a 59 year old female with a history of hypertension, GERD, chronic diarrhea, type 2 diabetes, abscess, hyperlipidemia, vitamin D deficiency who presents today with a chief complaint of abscess.  The abscess is located to the right groin which she first noticed a few months ago. She thought the abscess was healing as it was subcutaneous, but three to four days ago she noticed redness, swelling, development of a sore with a head, whitish/green drainage. Today the site has improved but she continues to notice discomfort.  She denies fevers. She hasn't applied anything topically OTC. She does have a history of these abscesses in the past, they typically resolve with antibiotic treatment.   Review of Systems  Constitutional: Negative for fever.  Skin: Positive for color change and wound.       Past Medical History:  Diagnosis Date  . Allergy   . Anxiety and depression   . Arthritis   . Chronic neck pain   . Diverticulosis   . GERD (gastroesophageal reflux disease)   . Hyperlipidemia   . Hypertension   . 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 Type 2 diabetes mellitus ([facility])      Social History   Socioeconomic History  . Marital status: Married    Spouse name: Not on file  . Number of children: Not on file  . Years of education: Not on file  . Highest education level: Not on file  Occupational History  . Not on file  Tobacco Use  . Smoking status: Never Smoker  . Smokeless tobacco: Never Used  Substance and Sexual Activity  . Alcohol use: Not on file  . Drug use: Not on file   . Sexual activity: Not on file  Other Topics Concern  . Not on file  Social History Narrative   Married.   5 children, 9 grandchildren.   Works in administration.    Enjoys watching her grandchildren, spending time with family.   Social Determinants of Health   Financial Resource Strain:   . Difficulty of Paying Living Expenses: Not on file  Food Insecurity:   . Worried About Programme researcher, broadcasting/film/video in the Last Year: Not on file  . Ran Out of Food in the Last Year: Not on file  Transportation Needs:   . Lack of Transportation (Medical): Not on file  . Lack of Transportation (Non-Medical): Not on file  Physical Activity:   . Days of Exercise per Week: Not on file  . Minutes of Exercise per Session: Not on file  Stress:   . Feeling of Stress : Not on file  Social Connections:   . Frequency of Communication with Friends and Family: Not on file  . Frequency of Social Gatherings with Friends and Family: Not on file  . Attends Religious Services: Not on file  . Active Member of Clubs or Organizations: Not on file  . Attends Banker Meetings: Not on file  . Marital Status: Not on file  Intimate Partner Violence:   . Fear of Current or Ex-Partner: Not on file  . Emotionally Abused:  Not on file  . Physically Abused: Not on file  . Sexually Abused: Not on file    Past Surgical History:  Procedure Laterality Date  . DG GALL BLADDER  1990    Family History  Problem Relation Age of Onset  . COPD Mother   . Diabetes Mother   . Heart disease Mother   . Diabetes Brother   . Hypertension Brother     Allergies  Allergen Reactions  . Latex Hives and Rash  . Clindamycin Rash  . Codeine Nausea Only    Other reaction(s): Other (See Comments) Sick feeling   . Quinolones Rash    Current Outpatient Medications on File Prior to Visit  Medication Sig Dispense Refill  . atorvastatin (LIPITOR) 10 MG tablet Take 1 tablet (10 mg total) by mouth every evening. 90 tablet 2    . cetirizine (ZYRTEC) 10 MG tablet TAKE 1 TABLET BY MOUTH EVERY DAY 90 tablet 1  . cyclobenzaprine (FLEXERIL) 5 MG tablet Take 1 tablet (5 mg total) by mouth 3 (three) times daily as needed for muscle spasms. 30 tablet 0  . gabapentin (NEURONTIN) 600 MG tablet Take 1 tablet (600 mg total) by mouth at bedtime. 90 tablet 1  . glipiZIDE (GLUCOTROL XL) 5 MG 24 hr tablet Take 1 tablet (5 mg total) by mouth daily with breakfast. For diabetes. 90 tablet 3  . losartan (COZAAR) 25 MG tablet TAKE 1 TABLET BY MOUTH EVERY DAY 90 tablet 1  . nortriptyline (PAMELOR) 10 MG capsule TAKE 1 CAPSULE (10 MG TOTAL) BY MOUTH AT BEDTIME. FOR DIARRHEA. 90 capsule 0  . pantoprazole (PROTONIX) 20 MG tablet Take 1 tablet (20 mg total) by mouth daily. 90 tablet 1  . Vitamin D, Ergocalciferol, (DRISDOL) 1.25 MG (50000 UT) CAPS capsule TAKE 1 CAPSULE BY MOUTH ONCE WEEKLY FOR 12 WEEKS. 12 capsule 0   No current facility-administered medications on file prior to visit.    BP 124/84   Pulse (!) 107   Temp (!) 97.5 F (36.4 C) (Temporal)   Ht 5\' 5"  (1.651 m)   Wt 198 lb 8 oz (90 kg)   SpO2 98%   BMI 33.03 kg/m    Objective:   Physical Exam  Constitutional: She is oriented to person, place, and time. She appears well-nourished.  Respiratory: Effort normal.  Neurological: She is alert and oriented to person, place, and time.  Skin: Skin is warm and dry. There is erythema.  Raised non draining/closed abscess to right groin. Tender. 1 cm firm oval shaped mass subcutaneously noted.            Assessment & Plan:

## 2019-03-18 NOTE — Telephone Encounter (Signed)
Refill sent to pharmacy. Will recheck vitamin D in April during visit.

## 2019-03-20 ENCOUNTER — Other Ambulatory Visit: Payer: Self-pay | Admitting: Primary Care

## 2019-03-20 DIAGNOSIS — K529 Noninfective gastroenteritis and colitis, unspecified: Secondary | ICD-10-CM

## 2019-03-23 ENCOUNTER — Other Ambulatory Visit: Payer: Self-pay | Admitting: Primary Care

## 2019-03-23 DIAGNOSIS — I10 Essential (primary) hypertension: Secondary | ICD-10-CM

## 2019-04-06 ENCOUNTER — Other Ambulatory Visit: Payer: Self-pay

## 2019-04-06 DIAGNOSIS — K529 Noninfective gastroenteritis and colitis, unspecified: Secondary | ICD-10-CM

## 2019-05-27 ENCOUNTER — Ambulatory Visit: Payer: BC Managed Care – PPO | Admitting: Primary Care

## 2019-05-27 ENCOUNTER — Other Ambulatory Visit: Payer: Self-pay

## 2019-05-27 ENCOUNTER — Encounter: Payer: Self-pay | Admitting: Primary Care

## 2019-05-27 VITALS — BP 126/82 | HR 102 | Temp 96.6°F | Ht 65.0 in | Wt 197.5 lb

## 2019-05-27 DIAGNOSIS — K219 Gastro-esophageal reflux disease without esophagitis: Secondary | ICD-10-CM

## 2019-05-27 DIAGNOSIS — N289 Disorder of kidney and ureter, unspecified: Secondary | ICD-10-CM

## 2019-05-27 DIAGNOSIS — E559 Vitamin D deficiency, unspecified: Secondary | ICD-10-CM | POA: Diagnosis not present

## 2019-05-27 DIAGNOSIS — G8929 Other chronic pain: Secondary | ICD-10-CM

## 2019-05-27 DIAGNOSIS — K529 Noninfective gastroenteritis and colitis, unspecified: Secondary | ICD-10-CM

## 2019-05-27 DIAGNOSIS — M542 Cervicalgia: Secondary | ICD-10-CM | POA: Diagnosis not present

## 2019-05-27 DIAGNOSIS — I10 Essential (primary) hypertension: Secondary | ICD-10-CM

## 2019-05-27 DIAGNOSIS — E785 Hyperlipidemia, unspecified: Secondary | ICD-10-CM | POA: Diagnosis not present

## 2019-05-27 DIAGNOSIS — E119 Type 2 diabetes mellitus without complications: Secondary | ICD-10-CM | POA: Diagnosis not present

## 2019-05-27 LAB — LIPID PANEL
Cholesterol: 149 mg/dL (ref 0–200)
HDL: 42 mg/dL (ref >39.00)
LDL Cholesterol: 69 mg/dL (ref 0–99)
NonHDL: 106.87
Total CHOL/HDL Ratio: 4
Triglycerides: 188 mg/dL — ABNORMAL HIGH (ref 0.0–149.0)
VLDL: 37.6 mg/dL (ref 0.0–40.0)

## 2019-05-27 LAB — COMPREHENSIVE METABOLIC PANEL
ALT: 31 U/L (ref 0–35)
AST: 26 U/L (ref 0–37)
Albumin: 4.2 g/dL (ref 3.5–5.2)
Alkaline Phosphatase: 102 U/L (ref 39–117)
BUN: 12 mg/dL (ref 6–23)
CO2: 25 mEq/L (ref 19–32)
Calcium: 9.4 mg/dL (ref 8.4–10.5)
Chloride: 105 mEq/L (ref 96–112)
Creatinine, Ser: 0.99 mg/dL (ref 0.40–1.20)
GFR: 57.92 mL/min — ABNORMAL LOW (ref >60.00)
Glucose, Bld: 124 mg/dL — ABNORMAL HIGH (ref 70–99)
Potassium: 4 mEq/L (ref 3.5–5.1)
Sodium: 139 mEq/L (ref 135–145)
Total Bilirubin: 0.7 mg/dL (ref 0.2–1.2)
Total Protein: 7.3 g/dL (ref 6.0–8.3)

## 2019-05-27 LAB — POCT GLYCOSYLATED HEMOGLOBIN (HGB A1C): Hemoglobin A1C: 6.1 % — AB (ref 4.0–5.6)

## 2019-05-27 LAB — VITAMIN D 25 HYDROXY (VIT D DEFICIENCY, FRACTURES): VITD: 43.35 ng/mL (ref 30.00–100.00)

## 2019-05-27 MED ORDER — NORTRIPTYLINE HCL 10 MG PO CAPS: 10.0000 mg | ORAL_CAPSULE | Freq: Every day | ORAL | 3 refills | Status: DC

## 2019-05-27 MED ORDER — GABAPENTIN 600 MG PO TABS: 600.0000 mg | ORAL_TABLET | Freq: Every day | ORAL | 3 refills | Status: DC

## 2019-05-27 NOTE — Patient Instructions (Signed)
Stop by the lab prior to leaving today. I will notify you of your results once received.   Send me a picture of the eczema cream.   It was a pleasure to see you today!

## 2019-05-27 NOTE — Assessment & Plan Note (Signed)
Doing well on gabapentin 600 mg HS, continue same. Refills sent to pharmacy. 

## 2019-05-27 NOTE — Assessment & Plan Note (Signed)
Doing well on nortriptyline, continue same. Refills provided.

## 2019-05-27 NOTE — Assessment & Plan Note (Signed)
Repeat renal function pending. Managed on losartan.

## 2019-05-27 NOTE — Assessment & Plan Note (Signed)
Compliant to weekly vitamin D, repeat vitamin D level pending.

## 2019-05-27 NOTE — Assessment & Plan Note (Signed)
Infrequent flares, no longer using pantoprazole.

## 2019-05-27 NOTE — Assessment & Plan Note (Signed)
Stable in the office today, continue losartan 25 mg. CMP pending.

## 2019-05-27 NOTE — Progress Notes (Signed)
Subjective:    Patient ID: Cheyenne Flowers, female    DOB: 02/25/1960, 59 y.o.   MRN: [id number]  HPI  This visit occurred during the SARS-CoV-2 public health emergency.  Safety protocols were in place, including screening questions prior to the visit, additional usage of staff PPE, and extensive cleaning of exam room while observing appropriate contact time as indicated for disinfecting solutions.   Ms. Cheyenne Flowers is a 59 year old female with a history of GERD, Hypertension, Type 2 Diabetes, Anxiety and Depression who presents today for follow up.  1) Type 2 Diabetes: Current medications include: Glipizide XL 5 mg   Last A1C: 6.1 in October 2020, 6.1 today Last Eye Exam: Due in June 2021 Last Foot Exam: Due today. Pneumonia Vaccination: Completed in 2019 ACE/ARB: Losartan Statin: Atorvastatin   2) Chronic Diarrhea: Currently managed on nortriptyline 10 mg. Overall doing very well on this regimen. She is needing refills soon.   3) Chronic Neck Pain: Currently managed on gabapentin 600 mg HS, overall doing well on this regimen. 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 She is needing refills today.   BP Readings from Last 3 Encounters:  05/27/19 126/82  03/17/19 124/84  02/13/19 126/86    Review of Systems  Respiratory: Negative for shortness of breath.   Cardiovascular: Negative for chest pain.  Musculoskeletal: Positive for arthralgias and neck pain.  Neurological: Negative for headaches.       Past Medical History:  Diagnosis Date  . Allergy   . Anxiety and depression   . Arthritis   . Chronic neck pain   . Diverticulosis   . GERD (gastroesophageal reflux disease)   . Hyperlipidemia   . Hypertension   . 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 Type 2 diabetes mellitus ([place])      Social History   Socioeconomic History  . Marital status: Married    Spouse name: Not on file  . Number of children: Not on file  . Years of education: Not on file  . Highest education level: Not on  file  Occupational History  . Not on file  Tobacco Use  . Smoking status: Never Smoker  . Smokeless tobacco: Never Used  Substance and Sexual Activity  . Alcohol use: Not on file  . Drug use: Not on file  . Sexual activity: Not on file  Other Topics Concern  . Not on file  Social History Narrative   Married.   5 children, 9 grandchildren.   Works in administration.    Enjoys watching her grandchildren, spending time with family.   Social Determinants of Health   Financial Resource Strain:   . Difficulty of Paying Living Expenses:   Food Insecurity:   . Worried About Charity fundraiser in the Last Year:   . Arboriculturist in the Last Year:   Transportation Needs:   . Film/video editor (Medical):   Marland Kitchen Lack of Transportation (Non-Medical):   Physical Activity:   . Days of Exercise per Week:   . Minutes of Exercise per Session:   Stress:   . Feeling of Stress :   Social Connections:   . Frequency of Communication with Friends and Family:   . Frequency of Social Gatherings with Friends and Family:   . Attends Religious Services:   . Active Member of Clubs or Organizations:   . Attends Archivist Meetings:   Marland Kitchen Marital Status:   Intimate Partner Violence:   . Fear of  Current or Ex-Partner:   . Emotionally Abused:   Marland Kitchen Physically Abused:   . Sexually Abused:     Past Surgical History:  Procedure Laterality Date  . DG GALL BLADDER  1990    Family History  Problem Relation Age of Onset  . COPD Mother   . Diabetes Mother   . Heart disease Mother   . Diabetes Brother   . Hypertension Brother     Allergies  Allergen Reactions  . Latex Hives and Rash  . Clindamycin Rash  . Codeine Nausea Only    Other reaction(s): Other (See Comments) Sick feeling   . Quinolones Rash    Current Outpatient Medications on File Prior to Visit  Medication Sig Dispense Refill  . atorvastatin (LIPITOR) 10 MG tablet Take 1 tablet (10 mg total) by mouth every  evening. 90 tablet 2  . cetirizine (ZYRTEC) 10 MG tablet TAKE 1 TABLET BY MOUTH EVERY DAY 90 tablet 1  . glipiZIDE (GLUCOTROL XL) 5 MG 24 hr tablet Take 1 tablet (5 mg total) by mouth daily with breakfast. For diabetes. 90 tablet 3  . losartan (COZAAR) 25 MG tablet TAKE 1 TABLET BY MOUTH EVERY DAY 90 tablet 1  . Vitamin D, Ergocalciferol, (DRISDOL) 1.25 MG (50000 UNIT) CAPS capsule TAKE 1 CAPSULE BY MOUTH ONCE WEEKLY FOR 12 WEEKS. 12 capsule 0   No current facility-administered medications on file prior to visit.    BP 126/82   Pulse (!) 102   Temp (!) 96.6 F (35.9 C) (Temporal)   Ht 5\' 5"  (1.651 m)   Wt 197 lb 8 oz (89.6 kg)   SpO2 98%   BMI 32.87 kg/m    Objective:   Physical Exam  Constitutional: She appears well-nourished.  Cardiovascular: Normal rate and regular rhythm.  Respiratory: Effort normal and breath sounds normal.  Musculoskeletal:     Cervical back: Neck supple.  Skin: Skin is warm and dry.  Psychiatric: She has a normal mood and affect.           Assessment & Plan:

## 2019-05-27 NOTE — Assessment & Plan Note (Signed)
Compliant to atorvastatin, continue same. Repeat lipids pending. 

## 2019-05-27 NOTE — Assessment & Plan Note (Signed)
A1C today of 6.1, excellent control on Glipizide Xl 5 mg.   Managed on statin and ARB. Pneumonia vaccination UTD. Foot exam today. Eye exam due in June 2021.  Follow up in 6 months.

## 2019-06-15 DIAGNOSIS — R21 Rash and other nonspecific skin eruption: Secondary | ICD-10-CM

## 2019-06-15 MED ORDER — TRIAMCINOLONE ACETONIDE 0.1 % EX CREA: 1.0000 "application " | TOPICAL_CREAM | Freq: Two times a day (BID) | CUTANEOUS | 0 refills | Status: DC

## 2019-07-12 ENCOUNTER — Other Ambulatory Visit: Payer: Self-pay | Admitting: Primary Care

## 2019-07-12 DIAGNOSIS — E785 Hyperlipidemia, unspecified: Secondary | ICD-10-CM

## 2019-09-04 ENCOUNTER — Encounter: Payer: Self-pay | Admitting: Primary Care

## 2019-09-04 ENCOUNTER — Other Ambulatory Visit: Payer: Self-pay

## 2019-09-04 ENCOUNTER — Ambulatory Visit: Payer: BC Managed Care – PPO | Admitting: Primary Care

## 2019-09-04 VITALS — BP 122/84 | HR 100 | Ht 65.0 in | Wt 199.0 lb

## 2019-09-04 DIAGNOSIS — R053 Chronic cough: Secondary | ICD-10-CM | POA: Insufficient documentation

## 2019-09-04 DIAGNOSIS — R05 Cough: Secondary | ICD-10-CM

## 2019-09-04 MED ORDER — MONTELUKAST SODIUM 10 MG PO TABS: 10.0000 mg | ORAL_TABLET | Freq: Every day | ORAL | 0 refills | Status: DC

## 2019-09-04 NOTE — Patient Instructions (Addendum)
Start monteleukast (Singulair) 10 mg nightly for allergies/cough/drainage.  Continue Zyrtec during the day.  Please update me in 2 weeks via My Chart.   It was a pleasure to see you today!

## 2019-09-04 NOTE — Assessment & Plan Note (Signed)
Suspect this is allergy induced based off of consistent postnasal drip and having to clear her throat.  I do believe there is a component of esophageal reflux aggravated from postnasal drip.  Start with Singulair at bedtime, continue Zyrtec in the morning.  Consider adding back pantoprazole/other esophageal reflux treatment if no significant improvement.  She will update in 2 weeks.

## 2019-09-04 NOTE — Progress Notes (Signed)
Subjective:    Patient ID: Cheyenne Flowers, female    DOB: 11-24-1960, 59 y.o.   MRN: [id number]  HPI  This visit occurred during the SARS-CoV-2 public health emergency.  Safety protocols were in place, including screening questions prior to the visit, additional usage of staff PPE, and extensive cleaning of exam room while observing appropriate contact time as indicated for disinfecting solutions.   Cheyenne Flowers is a 59 year old female with a history of hypertension, GERD, type 2 diabetes, hyperlipidemia who presents today with a chief complaint of cough.  Intermittent cough for the last year, worse in the morning with coughing up congestion, post nasal drip, clearing her throat.  Over the last 6 months she's noticed constant symptoms that are daily.   She stopped her pantoprazole several months ago, has done well overall since. Current symptoms of cough and drainage have not progressed since stopping.   She is compliant to the Zyrtec daily. She denies runny nose, nasal congestion, ear fullness, fevers.   Review of Systems  Constitutional: Negative for fever.  HENT: Positive for congestion and postnasal drip. Negative for ear pain, rhinorrhea, sinus pressure and sore throat.   Respiratory: Positive for cough.   Allergic/Immunologic: Positive for environmental allergies.       Past Medical History:  Diagnosis Date  . Acute foreign body of right ear canal 01/20/2019  . Allergy   . Anxiety and depression   . Arthritis   . Chronic neck pain   . Diverticulosis   . GERD (gastroesophageal reflux disease)   . Hyperlipidemia   . Hypertension   . 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 Type 2 diabetes mellitus ([facility])      Social History   Socioeconomic History  . Marital status: Married    Spouse name: Not on file  . Number of children: Not on file  . Years of education: Not on file  . Highest education level: Not on file  Occupational History  . Not on file  Tobacco Use  . Smoking status:  Never Smoker  . Smokeless tobacco: Never Used  Substance and Sexual Activity  . Alcohol use: Not on file  . Drug use: Not on file  . Sexual activity: Not on file  Other Topics Concern  . Not on file  Social History Narrative   Married.   5 children, 9 grandchildren.   Works in administration.    Enjoys watching her grandchildren, spending time with family.   Social Determinants of Health   Financial Resource Strain:   . Difficulty of Paying Living Expenses: Not on file  Food Insecurity:   . Worried About Programme researcher, broadcasting/film/video in the Last Year: Not on file  . Ran Out of Food in the Last Year: Not on file  Transportation Needs:   . Lack of Transportation (Medical): Not on file  . Lack of Transportation (Non-Medical): Not on file  Physical Activity:   . Days of Exercise per Week: Not on file  . Minutes of Exercise per Session: Not on file  Stress:   . Feeling of Stress : Not on file  Social Connections:   . Frequency of Communication with Friends and Family: Not on file  . Frequency of Social Gatherings with Friends and Family: Not on file  . Attends Religious Services: Not on file  . Active Member of Clubs or Organizations: Not on file  . Attends Banker Meetings: Not on file  . Marital Status: Not on  file  Intimate Partner Violence:   . Fear of Current or Ex-Partner: Not on file  . Emotionally Abused: Not on file  . Physically Abused: Not on file  . Sexually Abused: Not on file    Past Surgical History:  Procedure Laterality Date  . DG GALL BLADDER  1990    Family History  Problem Relation Age of Onset  . COPD Mother   . Diabetes Mother   . Heart disease Mother   . Diabetes Brother   . Hypertension Brother     Allergies  Allergen Reactions  . Latex Hives and Rash  . Clindamycin Rash  . Codeine Nausea Only    Other reaction(s): Other (See Comments) Sick feeling   . Quinolones Rash    Current Outpatient Medications on File Prior to Visit    Medication Sig Dispense Refill  . atorvastatin (LIPITOR) 10 MG tablet TAKE 1 TABLET BY MOUTH EVERY DAY IN THE EVENING 90 tablet 1  . cetirizine (ZYRTEC) 10 MG tablet TAKE 1 TABLET BY MOUTH EVERY DAY 90 tablet 1  . gabapentin (NEURONTIN) 600 MG tablet Take 1 tablet (600 mg total) by mouth at bedtime. For neck pain. 90 tablet 3  . glipiZIDE (GLUCOTROL XL) 5 MG 24 hr tablet Take 1 tablet (5 mg total) by mouth daily with breakfast. For diabetes. 90 tablet 3  . losartan (COZAAR) 25 MG tablet TAKE 1 TABLET BY MOUTH EVERY DAY 90 tablet 1  . nortriptyline (PAMELOR) 10 MG capsule Take 1 capsule (10 mg total) by mouth at bedtime. For diarrhea. 90 capsule 3  . pantoprazole (PROTONIX) 40 MG tablet Take by mouth.    . triamcinolone cream (KENALOG) 0.1 % Apply 1 application topically 2 (two) times daily. 15 g 0  . Vitamin D, Ergocalciferol, (DRISDOL) 1.25 MG (50000 UNIT) CAPS capsule TAKE 1 CAPSULE BY MOUTH ONCE WEEKLY FOR 12 WEEKS. 12 capsule 0   No current facility-administered medications on file prior to visit.    BP 122/84   Pulse 100   Ht 5\' 5"  (1.651 m)   Wt 199 lb (90.3 kg)   SpO2 97%   BMI 33.12 kg/m    Objective:   Physical Exam HENT:     Head:     Comments: Patient consistently clearing her throat during visit. Cardiovascular:     Rate and Rhythm: Normal rate and regular rhythm.  Pulmonary:     Effort: Pulmonary effort is normal.     Breath sounds: Normal breath sounds.  Musculoskeletal:     Cervical back: Neck supple.  Skin:    General: Skin is warm and dry.            Assessment & Plan:

## 2019-09-26 ENCOUNTER — Other Ambulatory Visit: Payer: Self-pay | Admitting: Primary Care

## 2019-09-26 DIAGNOSIS — R053 Chronic cough: Secondary | ICD-10-CM

## 2019-09-28 ENCOUNTER — Other Ambulatory Visit: Payer: Self-pay | Admitting: Primary Care

## 2019-09-28 DIAGNOSIS — I10 Essential (primary) hypertension: Secondary | ICD-10-CM

## 2019-11-11 ENCOUNTER — Other Ambulatory Visit (HOSPITAL_COMMUNITY)
Admission: RE | Admit: 2019-11-11 | Discharge: 2019-11-11 | Disposition: A | Discharge status: Home / Self Care | Payer: BC Managed Care – PPO | Source: Ambulatory Visit | Attending: Orthopedic Surgery | Admitting: Orthopedic Surgery

## 2019-11-11 ENCOUNTER — Encounter (HOSPITAL_COMMUNITY): Payer: Self-pay | Admitting: Orthopedic Surgery

## 2019-11-11 ENCOUNTER — Other Ambulatory Visit: Payer: Self-pay

## 2019-11-11 DIAGNOSIS — Z20822 Contact with and (suspected) exposure to covid-19: Secondary | ICD-10-CM | POA: Diagnosis not present

## 2019-11-11 DIAGNOSIS — Z01812 Encounter for preprocedural laboratory examination: Secondary | ICD-10-CM | POA: Diagnosis present

## 2019-11-11 LAB — SARS CORONAVIRUS 2 (TAT 6-24 HRS): SARS Coronavirus 2: NEGATIVE

## 2019-11-11 NOTE — Progress Notes (Signed)
COVID Vaccine Completed: Date COVID Vaccine completed: COVID vaccine manufacturer: Pfizer    Moderna   [employer]   PCP - Doreene Nest, NP Cardiologist -   Chest x-ray -  EKG -  Stress Test -  ECHO -  Cardiac Cath -  Pacemaker/ICD device last checked:  Sleep Study -  CPAP -   Fasting Blood Sugar -  Checks Blood Sugar _0____ times a day  Blood Thinner Instructions: Aspirin Instructions: Last Dose:  Anesthesia review:   Patient denies shortness of breath, fever, cough and chest pain at PAT appointment   Patient verbalized understanding of instructions that were given to them at the PAT appointment. Patient was also instructed that they will need to review over the PAT instructions again at home before surgery.

## 2019-11-12 ENCOUNTER — Ambulatory Visit (HOSPITAL_COMMUNITY): Payer: BC Managed Care – PPO | Admitting: Anesthesiology

## 2019-11-12 ENCOUNTER — Ambulatory Visit (HOSPITAL_COMMUNITY): Payer: BC Managed Care – PPO

## 2019-11-12 ENCOUNTER — Encounter (HOSPITAL_COMMUNITY): Payer: Self-pay | Admitting: Orthopedic Surgery

## 2019-11-12 ENCOUNTER — Encounter (HOSPITAL_COMMUNITY)
Admission: RE | Disposition: A | Discharge status: Home / Self Care | Payer: Self-pay | Source: Home / Self Care | Attending: Orthopedic Surgery

## 2019-11-12 ENCOUNTER — Ambulatory Visit (HOSPITAL_COMMUNITY)
Admission: RE | Admit: 2019-11-12 | Discharge: 2019-11-12 | Disposition: A | Discharge status: Home / Self Care | Payer: BC Managed Care – PPO | Attending: Orthopedic Surgery | Admitting: Orthopedic Surgery

## 2019-11-12 ENCOUNTER — Other Ambulatory Visit: Payer: Self-pay

## 2019-11-12 DIAGNOSIS — S42252A Displaced fracture of greater tuberosity of left humerus, initial encounter for closed fracture: Secondary | ICD-10-CM | POA: Insufficient documentation

## 2019-11-12 DIAGNOSIS — W19XXXA Unspecified fall, initial encounter: Secondary | ICD-10-CM | POA: Insufficient documentation

## 2019-11-12 DIAGNOSIS — Z419 Encounter for procedure for purposes other than remedying health state, unspecified: Secondary | ICD-10-CM

## 2019-11-12 DIAGNOSIS — S42252D Displaced fracture of greater tuberosity of left humerus, subsequent encounter for fracture with routine healing: Secondary | ICD-10-CM

## 2019-11-12 HISTORY — DX: Other complications of anesthesia, initial encounter: T88.59XA

## 2019-11-12 HISTORY — PX: ORIF HUMERUS FRACTURE: SHX2126

## 2019-11-12 HISTORY — DX: Nausea with vomiting, unspecified: R11.2

## 2019-11-12 HISTORY — DX: Other specified postprocedural states: Z98.890

## 2019-11-12 LAB — CBC
HCT: 44 % (ref 36.0–46.0)
Hemoglobin: 14.2 g/dL (ref 12.0–15.0)
MCH: 30.8 pg (ref 26.0–34.0)
MCHC: 32.3 g/dL (ref 30.0–36.0)
MCV: 95.4 fL (ref 80.0–100.0)
Platelets: 349 10*3/uL (ref 150–400)
RBC: 4.61 MIL/uL (ref 3.87–5.11)
RDW: 12.2 % (ref 11.5–15.5)
WBC: 9.3 10*3/uL (ref 4.0–10.5)
nRBC: 0 % (ref 0.0–0.2)

## 2019-11-12 LAB — HEMOGLOBIN A1C
Hgb A1c MFr Bld: 6.1 % — ABNORMAL HIGH (ref 4.8–5.6)
Mean Plasma Glucose: 128.37 mg/dL

## 2019-11-12 LAB — GLUCOSE, CAPILLARY
Glucose-Capillary: 107 mg/dL — ABNORMAL HIGH (ref 70–99)
Glucose-Capillary: 112 mg/dL — ABNORMAL HIGH (ref 70–99)

## 2019-11-12 LAB — BASIC METABOLIC PANEL
Anion gap: 11 (ref 5–15)
BUN: 11 mg/dL (ref 6–20)
CO2: 22 mmol/L (ref 22–32)
Calcium: 9.1 mg/dL (ref 8.9–10.3)
Chloride: 106 mmol/L (ref 98–111)
Creatinine, Ser: 0.86 mg/dL (ref 0.44–1.00)
GFR, Estimated: >60 mL/min (ref >60)
Glucose, Bld: 105 mg/dL — ABNORMAL HIGH (ref 70–99)
Potassium: 3.9 mmol/L (ref 3.5–5.1)
Sodium: 139 mmol/L (ref 135–145)

## 2019-11-12 SURGERY — OPEN REDUCTION INTERNAL FIXATION (ORIF) PROXIMAL HUMERUS FRACTURE
Anesthesia: Regional | Laterality: Left

## 2019-11-12 MED ORDER — CEFAZOLIN SODIUM-DEXTROSE 2-4 GM/100ML-% IV SOLN
2.0000 g | INTRAVENOUS | Status: AC
  Administered 2019-11-12: 2 g via INTRAVENOUS
  Filled 2019-11-12: qty 100

## 2019-11-12 MED ORDER — LIDOCAINE 2% (20 MG/ML) 5 ML SYRINGE
INTRAMUSCULAR | Status: AC
  Filled 2019-11-12: qty 5

## 2019-11-12 MED ORDER — 0.9 % SODIUM CHLORIDE (POUR BTL) OPTIME
TOPICAL | Status: DC | PRN
  Administered 2019-11-12: 1000 mL

## 2019-11-12 MED ORDER — PROPOFOL 10 MG/ML IV BOLUS
INTRAVENOUS | Status: AC
  Filled 2019-11-12: qty 20

## 2019-11-12 MED ORDER — CHLORHEXIDINE GLUCONATE 0.12 % MT SOLN
15.0000 mL | Freq: Once | OROMUCOSAL | Status: AC
  Administered 2019-11-12: 15 mL via OROMUCOSAL

## 2019-11-12 MED ORDER — DEXAMETHASONE SODIUM PHOSPHATE 10 MG/ML IJ SOLN
INTRAMUSCULAR | Status: AC
  Filled 2019-11-12: qty 1

## 2019-11-12 MED ORDER — MIDAZOLAM HCL 2 MG/2ML IJ SOLN
1.0000 mg | INTRAMUSCULAR | Status: DC
  Administered 2019-11-12: 2 mg via INTRAVENOUS
  Filled 2019-11-12: qty 2

## 2019-11-12 MED ORDER — ACETAMINOPHEN 500 MG PO TABS
1000.0000 mg | ORAL_TABLET | Freq: Once | ORAL | Status: AC
  Administered 2019-11-12: 1000 mg via ORAL
  Filled 2019-11-12: qty 2

## 2019-11-12 MED ORDER — FENTANYL CITRATE (PF) 100 MCG/2ML IJ SOLN
INTRAMUSCULAR | Status: DC | PRN
  Administered 2019-11-12 (×2): 50 ug via INTRAVENOUS

## 2019-11-12 MED ORDER — FENTANYL CITRATE (PF) 100 MCG/2ML IJ SOLN
50.0000 ug | INTRAMUSCULAR | Status: DC
  Administered 2019-11-12: 50 ug via INTRAVENOUS
  Filled 2019-11-12: qty 2

## 2019-11-12 MED ORDER — OXYCODONE-ACETAMINOPHEN 5-325 MG PO TABS
1.0000 | ORAL_TABLET | ORAL | 0 refills | Status: DC | PRN
Start: 2019-11-12 — End: 2020-07-05

## 2019-11-12 MED ORDER — BUPIVACAINE HCL (PF) 0.5 % IJ SOLN
INTRAMUSCULAR | Status: DC | PRN
  Administered 2019-11-12: 15 mL via PERINEURAL

## 2019-11-12 MED ORDER — OXYCODONE HCL 5 MG PO TABS: 5.0000 mg | ORAL_TABLET | Freq: Once | ORAL | Status: DC | PRN

## 2019-11-12 MED ORDER — PROMETHAZINE HCL 25 MG/ML IJ SOLN: 6.2500 mg | INTRAMUSCULAR | Status: DC | PRN

## 2019-11-12 MED ORDER — SCOPOLAMINE 1 MG/3DAYS TD PT72
1.0000 | MEDICATED_PATCH | TRANSDERMAL | Status: DC
  Administered 2019-11-12: 1.5 mg via TRANSDERMAL
  Filled 2019-11-12: qty 1

## 2019-11-12 MED ORDER — PHENYLEPHRINE 40 MCG/ML (10ML) SYRINGE FOR IV PUSH (FOR BLOOD PRESSURE SUPPORT)
PREFILLED_SYRINGE | INTRAVENOUS | Status: DC | PRN
  Administered 2019-11-12: 120 ug via INTRAVENOUS
  Administered 2019-11-12: 80 ug via INTRAVENOUS
  Administered 2019-11-12: 120 ug via INTRAVENOUS
  Administered 2019-11-12 (×3): 80 ug via INTRAVENOUS

## 2019-11-12 MED ORDER — CYCLOBENZAPRINE HCL 10 MG PO TABS: 10.0000 mg | ORAL_TABLET | Freq: Three times a day (TID) | ORAL | 1 refills | Status: DC | PRN

## 2019-11-12 MED ORDER — OXYCODONE HCL 5 MG/5ML PO SOLN: 5.0000 mg | Freq: Once | ORAL | Status: DC | PRN

## 2019-11-12 MED ORDER — ONDANSETRON HCL 4 MG/2ML IJ SOLN
INTRAMUSCULAR | Status: DC | PRN
  Administered 2019-11-12: 4 mg via INTRAVENOUS

## 2019-11-12 MED ORDER — BUPIVACAINE LIPOSOME 1.3 % IJ SUSP
INTRAMUSCULAR | Status: DC | PRN
  Administered 2019-11-12: 10 mL via PERINEURAL

## 2019-11-12 MED ORDER — PROPOFOL 10 MG/ML IV BOLUS
INTRAVENOUS | Status: DC | PRN
  Administered 2019-11-12: 150 mg via INTRAVENOUS

## 2019-11-12 MED ORDER — LACTATED RINGERS IV SOLN: INTRAVENOUS | Status: DC

## 2019-11-12 MED ORDER — ROCURONIUM BROMIDE 10 MG/ML (PF) SYRINGE
PREFILLED_SYRINGE | INTRAVENOUS | Status: DC | PRN
  Administered 2019-11-12: 100 mg via INTRAVENOUS

## 2019-11-12 MED ORDER — DEXAMETHASONE SODIUM PHOSPHATE 4 MG/ML IJ SOLN
INTRAMUSCULAR | Status: DC | PRN
  Administered 2019-11-12: 10 mg via INTRAVENOUS

## 2019-11-12 MED ORDER — ONDANSETRON HCL 4 MG PO TABS: 4.0000 mg | ORAL_TABLET | Freq: Three times a day (TID) | ORAL | 0 refills | Status: DC | PRN

## 2019-11-12 MED ORDER — SUGAMMADEX SODIUM 200 MG/2ML IV SOLN
INTRAVENOUS | Status: DC | PRN
  Administered 2019-11-12: 400 mg via INTRAVENOUS

## 2019-11-12 MED ORDER — KETOROLAC TROMETHAMINE 30 MG/ML IJ SOLN: 30.0000 mg | Freq: Once | INTRAMUSCULAR | Status: DC | PRN

## 2019-11-12 MED ORDER — ONDANSETRON HCL 4 MG/2ML IJ SOLN
INTRAMUSCULAR | Status: AC
  Filled 2019-11-12: qty 2

## 2019-11-12 MED ORDER — FENTANYL CITRATE (PF) 100 MCG/2ML IJ SOLN
INTRAMUSCULAR | Status: AC
  Filled 2019-11-12: qty 2

## 2019-11-12 MED ORDER — HYDROMORPHONE HCL 1 MG/ML IJ SOLN: 0.2500 mg | INTRAMUSCULAR | Status: DC | PRN

## 2019-11-12 MED ORDER — LIDOCAINE 2% (20 MG/ML) 5 ML SYRINGE
INTRAMUSCULAR | Status: DC | PRN
  Administered 2019-11-12: 60 mg via INTRAVENOUS

## 2019-11-12 MED ORDER — ORAL CARE MOUTH RINSE: 15.0000 mL | Freq: Once | OROMUCOSAL | Status: AC

## 2019-11-12 MED ORDER — ROCURONIUM BROMIDE 10 MG/ML (PF) SYRINGE
PREFILLED_SYRINGE | INTRAVENOUS | Status: AC
  Filled 2019-11-12: qty 10

## 2019-11-12 MED ORDER — MEPERIDINE HCL 50 MG/ML IJ SOLN: 6.2500 mg | INTRAMUSCULAR | Status: DC | PRN

## 2019-11-12 SURGICAL SUPPLY — 50 items
BAG ZIPLOCK 12X15 (MISCELLANEOUS) ×3 IMPLANT
BIT DRILL CANN 3.2MM (BIT) ×1 IMPLANT
COVER SURGICAL LIGHT HANDLE (MISCELLANEOUS) ×3 IMPLANT
COVER WAND RF STERILE (DRAPES) IMPLANT
DERMABOND ADVANCED (GAUZE/BANDAGES/DRESSINGS) ×2
DERMABOND ADVANCED .7 DNX12 (GAUZE/BANDAGES/DRESSINGS) ×1 IMPLANT
DRAPE C-ARM 42X120 X-RAY (DRAPES) ×3 IMPLANT
DRAPE C-ARMOR (DRAPES) IMPLANT
DRAPE ORTHO SPLIT 77X108 STRL (DRAPES) ×4
DRAPE SURG ORHT 6 SPLT 77X108 (DRAPES) ×2 IMPLANT
DRAPE U-SHAPE 47X51 STRL (DRAPES) ×3 IMPLANT
DRILL BIT CANN 3.2MM (BIT) ×2
DRSG AQUACEL AG ADV 3.5X 4 (GAUZE/BANDAGES/DRESSINGS) ×3 IMPLANT
DRSG AQUACEL AG ADV 3.5X 6 (GAUZE/BANDAGES/DRESSINGS) IMPLANT
DRSG AQUACEL AG ADV 3.5X10 (GAUZE/BANDAGES/DRESSINGS) IMPLANT
DURAPREP 26ML APPLICATOR (WOUND CARE) ×3 IMPLANT
ELECT REM PT RETURN 15FT ADLT (MISCELLANEOUS) ×3 IMPLANT
GLOVE BIO SURGEON STRL SZ7.5 (GLOVE) ×3 IMPLANT
GLOVE BIO SURGEON STRL SZ8 (GLOVE) ×3 IMPLANT
GLOVE SS BIOGEL STRL SZ 7 (GLOVE) ×1 IMPLANT
GLOVE SS BIOGEL STRL SZ 7.5 (GLOVE) ×1 IMPLANT
GLOVE SUPERSENSE BIOGEL SZ 7 (GLOVE) ×2
GLOVE SUPERSENSE BIOGEL SZ 7.5 (GLOVE) ×2
GOWN STRL REUS W/TWL LRG LVL3 (GOWN DISPOSABLE) ×6 IMPLANT
GUIDEWIRE THREADED 1.6 (WIRE) ×3 IMPLANT
KIT BASIN OR (CUSTOM PROCEDURE TRAY) ×3 IMPLANT
KIT TURNOVER KIT A (KITS) ×3 IMPLANT
MANIFOLD NEPTUNE II (INSTRUMENTS) ×3 IMPLANT
NEEDLE SPNL 18GX3.5 QUINCKE PK (NEEDLE) ×3 IMPLANT
NEEDLE TAPERED W/ NITINOL LOOP (MISCELLANEOUS) IMPLANT
NS IRRIG 1000ML POUR BTL (IV SOLUTION) ×3 IMPLANT
PACK SHOULDER (CUSTOM PROCEDURE TRAY) ×3 IMPLANT
PROTECTOR NERVE ULNAR (MISCELLANEOUS) ×3 IMPLANT
RESTRAINT HEAD UNIVERSAL NS (MISCELLANEOUS) ×3 IMPLANT
SCREW CANN 30MM FULLY (Screw) ×3 IMPLANT
SCREW CANN 34MM FULLY (Screw) ×6 IMPLANT
SLING ARM FOAM STRAP LRG (SOFTGOODS) IMPLANT
SLING ARM FOAM STRAP MED (SOFTGOODS) IMPLANT
SLING ARM FOAM STRAP SML (SOFTGOODS) IMPLANT
SLING ULTRA III MED (ORTHOPEDIC SUPPLIES) ×3 IMPLANT
SUCTION FRAZIER HANDLE 12FR (TUBING) ×2
SUCTION TUBE FRAZIER 12FR DISP (TUBING) ×1 IMPLANT
SUT FIBERWIRE #2 38 T-5 BLUE (SUTURE)
SUT MNCRL AB 3-0 PS2 18 (SUTURE) ×3 IMPLANT
SUT MON AB 2-0 CT1 36 (SUTURE) ×3 IMPLANT
SUT VIC AB 1 CT1 36 (SUTURE) ×3 IMPLANT
SUTURE FIBERWR #2 38 T-5 BLUE (SUTURE) IMPLANT
TOWEL OR 17X26 10 PK STRL BLUE (TOWEL DISPOSABLE) ×3 IMPLANT
TOWEL OR NON WOVEN STRL DISP B (DISPOSABLE) ×3 IMPLANT
WASHER FOR 4.5 SCREWS (Washer) ×6 IMPLANT

## 2019-11-12 NOTE — Transfer of Care (Signed)
Immediate Anesthesia Transfer of Care Note  Patient: Cheyenne Flowers  Procedure(s) Performed: open reduction internal fixation of left greater tuberosity fracture (Left )  Patient Location: PACU  Anesthesia Type:GA combined with regional for post-op pain  Level of Consciousness: awake, alert , oriented and patient cooperative  Airway & Oxygen Therapy: Patient Spontanous Breathing and Patient connected to face mask  Post-op Assessment: Report given to RN and Post -op Vital signs reviewed and stable  Post vital signs: Reviewed and stable  Last Vitals:  Vitals Value Taken Time  BP 142/92 11/12/19 1511  Temp    Pulse 104 11/12/19 1513  Resp 12 11/12/19 1513  SpO2 99 % 11/12/19 1513  Vitals shown include unvalidated device data.  Last Pain:  Vitals:   11/12/19 1231  TempSrc:   PainSc: 0-No pain         Complications: No complications documented.

## 2019-11-12 NOTE — Progress Notes (Signed)
Assisted Dr Beth Finucane with left, ultrasound guided, interscalene  block. Side rails up, monitors on throughout procedure. See vital signs in flow sheet. Tolerated Procedure well.  

## 2019-11-12 NOTE — Anesthesia Procedure Notes (Signed)
Procedure Name: Intubation Date/Time: 11/12/2019 1:43 PM Performed by: Vanessa Tolchester, CRNA Pre-anesthesia Checklist: Patient identified, Emergency Drugs available, Suction available and Patient being monitored Patient Re-evaluated:Patient Re-evaluated prior to induction Oxygen Delivery Method: Circle system utilized Preoxygenation: Pre-oxygenation with 100% oxygen Induction Type: IV induction Ventilation: Mask ventilation without difficulty Laryngoscope Size: 2 and Miller Grade View: Grade I Tube type: Oral Tube size: 7.0 mm Number of attempts: 1 Airway Equipment and Method: Stylet Placement Confirmation: ETT inserted through vocal cords under direct vision,  positive ETCO2 and breath sounds checked- equal and bilateral Secured at: 21 cm Tube secured with: Tape Dental Injury: Teeth and Oropharynx as per pre-operative assessment

## 2019-11-12 NOTE — Anesthesia Procedure Notes (Signed)
Anesthesia Regional Block: Interscalene brachial plexus block   Pre-Anesthetic Checklist: ,, timeout performed, Correct Patient, Correct Site, Correct Laterality, Correct Procedure, Correct Position, site marked, Risks and benefits discussed,  Surgical consent,  Pre-op evaluation,  At surgeon's request and post-op pain management  Laterality: Left  Prep: Maximum Sterile Barrier Precautions used, chloraprep       Needles:  Injection technique: Single-shot  Needle Type: Echogenic Stimulator Needle     Needle Length: 9cm  Needle Gauge: 22     Additional Needles:   Procedures:,,,, ultrasound used (permanent image in chart),,,,  Narrative:  Start time: 11/12/2019 1:06 PM End time: 11/12/2019 1:13 PM Injection made incrementally with aspirations every 5 mL.  Performed by: Personally  Anesthesiologist: Lannie Fields, DO  Additional Notes: Monitors applied. No increased pain on injection. No increased resistance to injection. Injection made in 5cc increments. Good needle visualization. Patient tolerated procedure well.

## 2019-11-12 NOTE — Anesthesia Postprocedure Evaluation (Signed)
Anesthesia Post Note  Patient: Cheyenne Flowers  Procedure(s) Performed: open reduction internal fixation of left greater tuberosity fracture (Left )     Patient location during evaluation: PACU Anesthesia Type: Regional and General Level of consciousness: awake and alert, oriented and patient cooperative Pain management: pain level controlled Vital Signs Assessment: post-procedure vital signs reviewed and stable Respiratory status: spontaneous breathing, nonlabored ventilation and respiratory function stable Cardiovascular status: blood pressure returned to baseline and stable Postop Assessment: no apparent nausea or vomiting Anesthetic complications: no   No complications documented.  Last Vitals:  Vitals:   11/12/19 1545 11/12/19 1600  BP: 138/87 (!) 144/91  Pulse: 98 97  Resp: 11 15  Temp:  (!) 36.3 C  SpO2: 93% 94%    Last Pain:  Vitals:   11/12/19 1545  TempSrc:   PainSc: 2                  Lannie Fields

## 2019-11-12 NOTE — H&P (Signed)
Cheyenne Flowers    Chief Complaint: Left displaced greater tuberosity fracture HPI: The patient is a 59 y.o. female status post ground level fall injuring her left shoulder with x-rays showing a moderately displaced greater tuberosity fracture.  Due to the degree of displacement patient is brought to the operating this time for planned percutaneous fixation versus open reduction and internal fixation.  Past Medical History:  Diagnosis Date  . Acute foreign body of right ear canal 01/20/2019  . Allergy   . Anxiety and depression   . Arthritis   . Chronic neck pain   . Complication of anesthesia   . Diverticulosis   . GERD (gastroesophageal reflux disease)   . Hyperlipidemia   . Hypertension   . PONV (postoperative nausea and vomiting)   . 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 Type 2 diabetes mellitus (HCC)     Past Surgical History:  Procedure Laterality Date  . CERVICAL FUSION  2007  . CHOLECYSTECTOMY    . DG GALL BLADDER  1990  . EXTERNAL EAR SURGERY  2008, 2017    Family History  Problem Relation Age of Onset  . COPD Mother   . Diabetes Mother   . Heart disease Mother   . Diabetes Brother   . Hypertension Brother     Social History:  reports that she has never smoked. She has never used smokeless tobacco. She reports that she does not drink alcohol and does not use drugs.   Medications Prior to Admission  Medication Sig Dispense Refill  . acetaminophen (TYLENOL) 650 MG CR tablet Take 1,300 mg by mouth daily.    Marland Kitchen atorvastatin (LIPITOR) 10 MG tablet TAKE 1 TABLET BY MOUTH EVERY DAY IN THE EVENING (Patient taking differently: Take 10 mg by mouth at bedtime. ) 90 tablet 1  . cetirizine (ZYRTEC) 10 MG tablet TAKE 1 TABLET BY MOUTH EVERY DAY (Patient taking differently: Take 10 mg by mouth daily. ) 90 tablet 1  . gabapentin (NEURONTIN) 600 MG tablet Take 1 tablet (600 mg total) by mouth at bedtime. For neck pain. 90 tablet 3  . glipiZIDE (GLUCOTROL XL) 5 MG 24 hr tablet Take 1 tablet  (5 mg total) by mouth daily with breakfast. For diabetes. 90 tablet 3  . ibuprofen (ADVIL) 200 MG tablet Take 800 mg by mouth every 8 (eight) hours as needed (pain.).    Marland Kitchen losartan (COZAAR) 25 MG tablet TAKE 1 TABLET BY MOUTH EVERY DAY (Patient taking differently: Take 25 mg by mouth daily. ) 90 tablet 1  . montelukast (SINGULAIR) 10 MG tablet TAKE 1 TABLET (10 MG TOTAL) BY MOUTH AT BEDTIME. FOR ALLERGIES. 90 tablet 3  . nortriptyline (PAMELOR) 10 MG capsule Take 1 capsule (10 mg total) by mouth at bedtime. For diarrhea. 90 capsule 3  . triamcinolone cream (KENALOG) 0.1 % Apply 1 application topically 2 (two) times daily. (Patient taking differently: Apply 1 application topically 2 (two) times daily as needed (ezcema flares). ) 15 g 0  . Vitamin D, Ergocalciferol, (DRISDOL) 1.25 MG (50000 UNIT) CAPS capsule TAKE 1 CAPSULE BY MOUTH ONCE WEEKLY FOR 12 WEEKS. (Patient taking differently: Take 50,000 Units by mouth every Saturday. ) 12 capsule 0     Physical Exam: Left shoulder demonstrates a painful and guarded range of motion.  She is neurovascular intact distally in the left upper extremity.  Plain radiographs show a moderately displaced fracture of the most posterior aspect of the greater tuberosity.  Vitals  Temp:  [98 F (36.7  C)] 98 F (36.7 C) (11/04 1229) Pulse Rate:  [110] 110 (11/04 1229) Resp:  [18] 18 (11/04 1229) BP: (155)/(109) 155/109 (11/04 1229) SpO2:  [98 %] 98 % (11/04 1229) Weight:  [88.8 kg] 88.8 kg (11/04 1229)  Assessment/Plan  Impression: Left displaced greater tuberosity fracture  Plan of Action: Procedure(s): Percutaneous versus open reduction internal fixation of left greater tuberosity fracture  Cheyenne Flowers M Earley Grobe 11/12/2019, 12:47 PM Contact # [phone]

## 2019-11-12 NOTE — Op Note (Signed)
11/12/2019  2:54 PM  PATIENT:   Cheyenne Flowers  59 y.o. female  PRE-OPERATIVE DIAGNOSIS:  Left displaced greater tuberosity fracture  POST-OPERATIVE DIAGNOSIS: Same  PROCEDURE: Open reduction and internal fixation of displaced left shoulder greater tuberosity fracture utilizing multiple cannulated screws  SURGEON:  Juanna Pudlo, Vania Rea M.D.  ASSISTANTS: Ralene Bathe, PA-C  ANESTHESIA:   General endotracheal and interscalene block with Exparel  EBL: Less than 100 cc  SPECIMEN: None  Drains: None   PATIENT DISPOSITION:  PACU - hemodynamically stable.    PLAN OF CARE: Discharge to home after PACU  Brief history:  Patient is a 59 year old female status post a ground-level fall injuring the left shoulder with radiographs showing a moderately displaced greater tuberosity fracture.  Due to the degree of displacement she is brought to the operating this time for planned open reduction and internal fixation versus percutaneous pinning.  Preoperatively I counseled the patient regarding treatment options as well as the potential risks versus benefits thereof.  Possible surgical complications were reviewed including bleeding, infection, neurovascular injury, malunion, nonunion, loss of fixation, and possible need for additional surgery.  She understands, and accepts, and agrees with our planned procedure.  Procedure in detail:  After undergoing routine preop evaluation the patient received prophylactic antibiotics and interscalene block with Exparel was established in the holding area by the anesthesia department.  Patient then placed supine on the operating table and underwent the smooth induction of a general endotracheal anesthesia.  Placed into the beachchair position and appropriate padding protected.  At this time fluoroscopy was brought in to confirm that the fracture could be properly visualized.  The left shoulder region was then sterilely prepped and draped in standard fashion.   Timeout was called.  We initially localized the fracture fragment using a percutaneous technique with a spinal needle although it became clear that the fracture fragment was significantly larger than we had originally anticipated.  With this finding we went ahead and made a 4 cm posterior lateral incision off the margin of the acromion and dissected deeply through the skin and subtenons tissues obtaining hemostasis with electrocautery.  The deltoid fibers were then split with axis then gained to the subdeltoid region.  The bursal tissue was cleared we could visualize the greater tuberosity and noted that there was some complexity to the fracture with posterior fragment and an anterior fragment both of which show would benefit from fixation.  We initially transfix the posterior fragment with two 4.5 cannulated screws one with a washer 1 without.  The more anterior fragment was reduced and transfixed with a single 4.5 cannulated screw with a washer.  Good purchase was achieved.  Final fluoroscopic images were then obtained confirming good reduction of the fracture in good alignment of the hardware.  The wound was then copiously irrigated.  The deltoid fascia was reapproximated with a series of interrupted figure-of-eight and 1 Vicryl sutures.  2-0 Vicryl used in the subcu layer and intracuticular 3-0 Monocryl for the skin followed by Dermabond and Aquacel dressing.  Left arm was then placed in a sling with abduction pillow and the patient was awakened, extubated, and taken to the recovery in stable condition.  Ralene Bathe, PA-C was utilized as an Geophysicist/field seismologist throughout this case, essential for help with positioning the patient, positioning extremity, tissue manipulation, implantation of the prosthesis, suture management, wound closure, and intraoperative decision-making.  Senaida Lange MD   Contact # [phone]

## 2019-11-12 NOTE — Anesthesia Preprocedure Evaluation (Addendum)
Anesthesia Evaluation  Patient identified by MRN, date of birth, ID band Patient awake    Reviewed: Allergy & Precautions, NPO status , Patient's Chart, lab work & pertinent test results  History of Anesthesia Complications (+) PONV and history of anesthetic complications  Airway Mallampati: III  TM Distance: >3 FB Neck ROM: Full    Dental no notable dental hx. (+) Teeth Intact, Dental Advisory Given   Pulmonary neg pulmonary ROS,    Pulmonary exam normal breath sounds clear to auscultation       Cardiovascular hypertension, Pt. on medications Normal cardiovascular exam Rhythm:Regular Rate:Normal     Neuro/Psych PSYCHIATRIC DISORDERS Anxiety Depression negative neurological ROS     GI/Hepatic Neg liver ROS, GERD  Medicated and Controlled,  Endo/Other  diabetes, Well Controlled, Type 2, Oral Hypoglycemic AgentsLast a1c 6.09 May 2019  Renal/GU negative Renal ROS  negative genitourinary   Musculoskeletal  (+) Arthritis , Osteoarthritis,  Left displaced greater tuberosity fracture   Abdominal   Peds  Hematology negative hematology ROS (+)   Anesthesia Other Findings   Reproductive/Obstetrics negative OB ROS                           Anesthesia Physical Anesthesia Plan  ASA: II  Anesthesia Plan: General and Regional   Post-op Pain Management: GA combined w/ Regional for post-op pain   Induction: Intravenous  PONV Risk Score and Plan: 4 or greater and Ondansetron, Dexamethasone, Midazolam, Scopolamine patch - Pre-op and Treatment may vary due to age or medical condition  Airway Management Planned: Oral ETT  Additional Equipment: None  Intra-op Plan:   Post-operative Plan: Extubation in OR  Informed Consent: I have reviewed the patients History and Physical, chart, labs and discussed the procedure including the risks, benefits and alternatives for the proposed anesthesia with the  patient or authorized representative who has indicated his/her understanding and acceptance.     Dental advisory given  Plan Discussed with: CRNA  Anesthesia Plan Comments:        Anesthesia Quick Evaluation

## 2019-11-12 NOTE — Discharge Instructions (Signed)
Cheyenne Flowers, M.D., F.A.A.O.S. Orthopaedic Surgery Specializing in Arthroscopic and Reconstructive Surgery of the Shoulder [phone] [address] [place], [place] [address] - Fax [phone]   POST-OP  INSTRUCTIONS  1. Follow up in the office for your first post-op appointment 10-14 days from the date of your surgery. If you do not already have a scheduled appointment, our office will contact you to schedule.  2. The bandage over your incision is waterproof. You may begin showering with this dressing on. You may leave this dressing on until first follow up appointment within 2 weeks. We prefer you leave this dressing in place until follow up however after 5-7 days if you are having itching or skin irritation and would like to remove it you may do so. Go slow and tug at the borders gently to break the bond the dressing has with the skin. At this point if there is no drainage it is okay to go without a bandage or you may cover it with a light guaze and tape. You can also expect significant bruising around your shoulder that will drift down your arm and into your chest wall. This is very normal and should resolve over several days.   3. Wear your sling/immobilizer at all times except to perform the exercises below or to occasionally let your arm dangle by your side to stretch your elbow. You also need to sleep in your sling immobilizer until instructed otherwise. It is ok to remove your sling if you are sitting in a controlled environment and allow your arm to rest in a position of comfort by your side or on your lap with pillows to give your neck and skin a break from the sling. You may remove it to allow arm to dangle by side to shower. If you are up walking around and when you go to sleep at night you need to wear it.  4. Range of motion to your elbow, wrist, and hand are encouraged 3-5 times daily. Exercise to your hand and fingers helps to reduce swelling you may  experience.   5. Prescriptions for a pain medication and a muscle relaxant are provided for you. It is recommended that if you are experiencing pain that you pain medication alone is not controlling, add the muscle relaxant along with the pain medication which can give additional pain relief. The first 1-2 days is generally the most severe of your pain and then should gradually decrease. As your pain lessens it is recommended that you decrease your use of the pain medications to an "as needed basis'" only and to always comply with the recommended dosages of the pain medications.  6. Pain medications can produce constipation along with their use. If you experience this, the use of an over the counter stool softener or laxative daily is recommended.   7. For additional questions or concerns, please do not hesitate to call the office. If after hours there is an answering service to forward your concerns to the physician on call.  8.Pain control following an exparel block  To help control your post-operative pain you received a nerve block  performed with Exparel which is a long acting anesthetic (numbing agent) which can provide pain relief and sensations of numbness (and relief of pain) in the operative shoulder and arm for up to 3 days. Sometimes it provides mixed relief, meaning you may still have numbness in certain areas of the arm but can still be able to move  parts of that arm, hand, and fingers. We recommend that your prescribed pain medications  be used as needed. We do not feel it is necessary to "pre medicate" and "stay ahead" of pain.  Taking narcotic pain medications when you are not having any pain can lead to unnecessary and potentially dangerous side effects.    9. Use the ice machine as much as possible in the first 5-7 days from surgery, then you can wean its use to as needed. The ice typically needs to be replaced every 6 hours, instead of ice you can actually freeze water bottles to  put in the cooler and then fill water around them to avoid having to purchase ice. You can have spare water bottles freezing to allow you to rotate them once they have melted. Try to have a thin shirt or light cloth or towel under the ice wrap to protect your skin.   FOR ADDITIONAL INFO ON ICE MACHINE AND INSTRUCTIONS GO TO THE WEBSITE AT  [email or web address]   10.  We recommend that you avoid any dental work or cleaning in the first 3 months following your joint replacement. This is to help minimize the possibility of infection from the bacteria in your mouth that enters your bloodstream during dental work. We also recommend that you take an antibiotic prior to your dental work for the first year after your shoulder replacement to further help reduce that risk. Please simply contact our office for antibiotics to be sent to your pharmacy prior to dental work.   POST-OP EXERCISES  Pendulum Exercises  Perform pendulum exercises while standing and bending at the waist. Support your uninvolved arm on a table or chair and allow your operated arm to hang freely. Make sure to do these exercises passively - not using you shoulder muscles. These exercises can be performed once your nerve block effects have worn off.  Repeat 20 times. Do 3 sessions per day.

## 2019-11-16 ENCOUNTER — Encounter (HOSPITAL_COMMUNITY): Payer: Self-pay | Admitting: Orthopedic Surgery

## 2019-12-05 ENCOUNTER — Other Ambulatory Visit: Payer: Self-pay | Admitting: Primary Care

## 2019-12-05 DIAGNOSIS — E119 Type 2 diabetes mellitus without complications: Secondary | ICD-10-CM

## 2020-01-12 ENCOUNTER — Other Ambulatory Visit: Payer: Self-pay | Admitting: Primary Care

## 2020-01-12 DIAGNOSIS — E785 Hyperlipidemia, unspecified: Secondary | ICD-10-CM

## 2020-03-15 ENCOUNTER — Other Ambulatory Visit: Payer: Self-pay | Admitting: Primary Care

## 2020-03-15 DIAGNOSIS — I10 Essential (primary) hypertension: Secondary | ICD-10-CM

## 2020-06-09 ENCOUNTER — Other Ambulatory Visit: Payer: Self-pay | Admitting: Primary Care

## 2020-06-09 DIAGNOSIS — I10 Essential (primary) hypertension: Secondary | ICD-10-CM

## 2020-06-09 DIAGNOSIS — K529 Noninfective gastroenteritis and colitis, unspecified: Secondary | ICD-10-CM

## 2020-06-09 NOTE — Telephone Encounter (Signed)
Patient is overdue for CPE/follow up, this will be required prior to any further refills.  Please schedule.   

## 2020-06-13 NOTE — Telephone Encounter (Signed)
Called patient information given appointment has been made for 07/01/2020

## 2020-07-03 ENCOUNTER — Other Ambulatory Visit: Payer: Self-pay | Admitting: Primary Care

## 2020-07-03 DIAGNOSIS — K529 Noninfective gastroenteritis and colitis, unspecified: Secondary | ICD-10-CM

## 2020-07-03 DIAGNOSIS — I10 Essential (primary) hypertension: Secondary | ICD-10-CM

## 2020-07-05 ENCOUNTER — Other Ambulatory Visit: Payer: Self-pay

## 2020-07-05 ENCOUNTER — Ambulatory Visit (INDEPENDENT_AMBULATORY_CARE_PROVIDER_SITE_OTHER)
Admission: RE | Admit: 2020-07-05 | Discharge: 2020-07-05 | Disposition: A | Discharge status: Home / Self Care | Payer: BC Managed Care – PPO | Source: Ambulatory Visit | Attending: Primary Care | Admitting: Primary Care

## 2020-07-05 ENCOUNTER — Encounter: Payer: Self-pay | Admitting: Primary Care

## 2020-07-05 ENCOUNTER — Ambulatory Visit (INDEPENDENT_AMBULATORY_CARE_PROVIDER_SITE_OTHER): Payer: BC Managed Care – PPO | Admitting: Primary Care

## 2020-07-05 VITALS — BP 124/80 | HR 96 | Temp 98.3°F | Ht 64.75 in | Wt 196.5 lb

## 2020-07-05 DIAGNOSIS — Z1231 Encounter for screening mammogram for malignant neoplasm of breast: Secondary | ICD-10-CM

## 2020-07-05 DIAGNOSIS — I10 Essential (primary) hypertension: Secondary | ICD-10-CM

## 2020-07-05 DIAGNOSIS — M25551 Pain in right hip: Secondary | ICD-10-CM

## 2020-07-05 DIAGNOSIS — F419 Anxiety disorder, unspecified: Secondary | ICD-10-CM

## 2020-07-05 DIAGNOSIS — K529 Noninfective gastroenteritis and colitis, unspecified: Secondary | ICD-10-CM

## 2020-07-05 DIAGNOSIS — E119 Type 2 diabetes mellitus without complications: Secondary | ICD-10-CM

## 2020-07-05 DIAGNOSIS — E785 Hyperlipidemia, unspecified: Secondary | ICD-10-CM

## 2020-07-05 DIAGNOSIS — Z0000 Encounter for general adult medical examination without abnormal findings: Secondary | ICD-10-CM

## 2020-07-05 DIAGNOSIS — G8929 Other chronic pain: Secondary | ICD-10-CM

## 2020-07-05 DIAGNOSIS — F32A Depression, unspecified: Secondary | ICD-10-CM

## 2020-07-05 DIAGNOSIS — R053 Chronic cough: Secondary | ICD-10-CM

## 2020-07-05 DIAGNOSIS — M542 Cervicalgia: Secondary | ICD-10-CM | POA: Diagnosis not present

## 2020-07-05 LAB — LIPID PANEL
Cholesterol: 155 mg/dL (ref 0–200)
HDL: 39.8 mg/dL (ref >39.00)
NonHDL: 115.47
Total CHOL/HDL Ratio: 4
Triglycerides: 231 mg/dL — ABNORMAL HIGH (ref 0.0–149.0)
VLDL: 46.2 mg/dL — ABNORMAL HIGH (ref 0.0–40.0)

## 2020-07-05 LAB — COMPREHENSIVE METABOLIC PANEL
ALT: 27 U/L (ref 0–35)
AST: 27 U/L (ref 0–37)
Albumin: 4.4 g/dL (ref 3.5–5.2)
Alkaline Phosphatase: 104 U/L (ref 39–117)
BUN: 11 mg/dL (ref 6–23)
CO2: 28 mEq/L (ref 19–32)
Calcium: 9.6 mg/dL (ref 8.4–10.5)
Chloride: 104 mEq/L (ref 96–112)
Creatinine, Ser: 0.92 mg/dL (ref 0.40–1.20)
GFR: 69.08 mL/min (ref >60.00)
Glucose, Bld: 91 mg/dL (ref 70–99)
Potassium: 3.7 mEq/L (ref 3.5–5.1)
Sodium: 140 mEq/L (ref 135–145)
Total Bilirubin: 0.9 mg/dL (ref 0.2–1.2)
Total Protein: 7.5 g/dL (ref 6.0–8.3)

## 2020-07-05 LAB — LDL CHOLESTEROL, DIRECT: Direct LDL: 91 mg/dL

## 2020-07-05 LAB — HEMOGLOBIN A1C: Hgb A1c MFr Bld: 6.4 % (ref 4.6–6.5)

## 2020-07-05 MED ORDER — GABAPENTIN 600 MG PO TABS: 600.0000 mg | ORAL_TABLET | Freq: Every day | ORAL | 3 refills | Status: DC

## 2020-07-05 NOTE — Assessment & Plan Note (Signed)
Immunizations up-to-date. Pap smear overdue, she will schedule an appointment with GYN. Mammogram overdue, orders placed Colonoscopy up-to-date, due in 2024.  Encouraged regular exercise and healthy diet.  Exam today as noted. Labs pending.

## 2020-07-05 NOTE — Assessment & Plan Note (Signed)
Doing well on nightly nortriptyline 10 mg, continue same.

## 2020-07-05 NOTE — Assessment & Plan Note (Signed)
Doing well with gabapentin 600 mg at bedtime, continue same.

## 2020-07-05 NOTE — Assessment & Plan Note (Signed)
Doing well on montelukast 10 mg, continue same.

## 2020-07-05 NOTE — Progress Notes (Signed)
Subjective:    Patient ID: Cheyenne Flowers, female    DOB: [birth year], 60 y.o.   MRN: [id number]  HPI  Cheyenne Flowers is a very pleasant 60 y.o. female who presents today for complete physical.  She would also like to discuss chronic hip pain. Located bilaterally, chronic to right for years, now with left hip pain. Feels unsteady, stiffness to the hips. She denies numbness/tingling. Pain is worse to hips when standing after sitting for prolonged periods of time or when standing for prolonged periods of time.  She has been walking with her husband, this does not aggravate pain.  Immunizations: -Tetanus: 2020 -Influenza: Due this season  -Covid-19: 3 vaccines -Shingles: Completed Shingrix  Diet: Fair diet.  Exercise: No regular exercise. Some walking in the evenings.   Eye exam: Completes annually  Dental exam: Completes semi-annually   Pap Smear: Due, she will schedule Mammogram: No recent mammogram.  Colonoscopy: Completed in 2019, due in 2024  BP Readings from Last 3 Encounters:  07/05/20 124/80  11/12/19 (!) 143/98  09/04/19 122/84       Review of Systems  Constitutional:  Negative for unexpected weight change.  HENT:  Negative for rhinorrhea.   Respiratory:  Negative for cough and shortness of breath.   Cardiovascular:  Negative for chest pain.  Gastrointestinal:  Negative for constipation and diarrhea.  Genitourinary:  Negative for difficulty urinating.  Musculoskeletal:  Positive for arthralgias.  Skin:  Negative for rash.  Allergic/Immunologic: Positive for environmental allergies.  Neurological:  Negative for dizziness and headaches.  Psychiatric/Behavioral:  The patient is not nervous/anxious.         Past Medical History:  Diagnosis Date   Acute foreign body of right ear canal 01/20/2019   Allergy    Anxiety and depression    Arthritis    Chronic neck pain    Complication of anesthesia    Diverticulosis    GERD (gastroesophageal reflux disease)     Hyperlipidemia    Hypertension    PONV (postoperative nausea and vomiting)    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HCC)    Type 2 diabetes mellitus (HCC)     Social History   Socioeconomic History   Marital status: Married    Spouse name: Not on file   Number of children: Not on file   Years of education: Not on file   Highest education level: Not on file  Occupational History   Not on file  Tobacco Use   Smoking status: Never   Smokeless tobacco: Never  Vaping Use   Vaping Use: Never used  Substance and Sexual Activity   Alcohol use: Never   Drug use: Never   Sexual activity: Not on file  Other Topics Concern   Not on file  Social History Narrative   Married.   5 children, 9 grandchildren.   Works in administration.    Enjoys watching her grandchildren, spending time with family.   Social Determinants of Health   Financial Resource Strain: Not on file  Food Insecurity: Not on file  Transportation Needs: Not on file  Physical Activity: Not on file  Stress: Not on file  Social Connections: Not on file  Intimate Partner Violence: Not on file    Past Surgical History:  Procedure Laterality Date   CERVICAL FUSION  2007   CHOLECYSTECTOMY     DG GALL BLADDER  1990   EXTERNAL EAR SURGERY  2008, 2017   ORIF HUMERUS FRACTURE Left 11/12/2019   Procedure: open reduction internal  fixation of left greater tuberosity fracture;  Surgeon: Francena Hanly, MD;  Location: [facility];  Service: Orthopedics;  Laterality: Left;     Family History  Problem Relation Age of Onset   COPD Mother    Diabetes Mother    Heart disease Mother    Diabetes Brother    Hypertension Brother     Allergies  Allergen Reactions   Latex Hives and Rash   Clindamycin Rash   Codeine Nausea Only    Other reaction(s): Other (See Comments) Sick feeling    Quinolones Rash    Current Outpatient Medications on File Prior to Visit  Medication Sig Dispense Refill   atorvastatin (LIPITOR) 10 MG tablet TAKE 1  TABLET BY MOUTH EVERY DAY IN THE EVENING 90 tablet 1   cetirizine (ZYRTEC) 10 MG tablet TAKE 1 TABLET BY MOUTH EVERY DAY 90 tablet 1   cyclobenzaprine (FLEXERIL) 10 MG tablet Take 1 tablet (10 mg total) by mouth 3 (three) times daily as needed for muscle spasms. 30 tablet 1   gabapentin (NEURONTIN) 600 MG tablet Take 1 tablet (600 mg total) by mouth at bedtime. For neck pain. 90 tablet 3   glipiZIDE (GLUCOTROL XL) 5 MG 24 hr tablet TAKE 1 TABLET (5 MG TOTAL) BY MOUTH DAILY WITH BREAKFAST. FOR DIABETES. 90 tablet 3   losartan (COZAAR) 25 MG tablet Take 1 tablet (25 mg total) by mouth daily. For blood pressure. 30 tablet 0   montelukast (SINGULAIR) 10 MG tablet TAKE 1 TABLET (10 MG TOTAL) BY MOUTH AT BEDTIME. FOR ALLERGIES. 90 tablet 3   nortriptyline (PAMELOR) 10 MG capsule TAKE 1 CAPSULE (10 MG TOTAL) BY MOUTH AT BEDTIME. FOR DIARRHEA. 30 capsule 0   triamcinolone cream (KENALOG) 0.1 % Apply 1 application topically 2 (two) times daily. (Patient taking differently: Apply 1 application topically 2 (two) times daily as needed (ezcema flares).) 15 g 0   No current facility-administered medications on file prior to visit.    BP 124/80 (BP Location: Left Arm, Patient Position: Sitting, Cuff Size: Large)   Pulse 96   Temp 98.3 F (36.8 C) (Temporal)   Ht 5' 4.75" (1.645 m)   Wt 196 lb 8 oz (89.1 kg)   SpO2 98%   BMI 32.95 kg/m  Objective:   Physical Exam HENT:     Right Ear: Tympanic membrane and ear canal normal.     Left Ear: Tympanic membrane and ear canal normal.     Nose: Nose normal.  Eyes:     Conjunctiva/sclera: Conjunctivae normal.     Pupils: Pupils are equal, round, and reactive to light.  Neck:     Thyroid: No thyromegaly.  Cardiovascular:     Rate and Rhythm: Normal rate and regular rhythm.     Heart sounds: No murmur heard. Pulmonary:     Effort: Pulmonary effort is normal.     Breath sounds: Normal breath sounds. No rales.  Abdominal:     General: Bowel sounds are  normal.     Palpations: Abdomen is soft.     Tenderness: There is no abdominal tenderness.  Musculoskeletal:     Cervical back: Neck supple.     Right hip: Normal range of motion. Normal strength.     Left hip: Normal range of motion. Normal strength.     Comments: Ambulates well in clinic, able to move up and down from exam table without difficulty.  Lymphadenopathy:     Cervical: No cervical adenopathy.  Skin:  General: Skin is warm and dry.     Findings: No rash.  Neurological:     Mental Status: She is alert and oriented to person, place, and time.     Cranial Nerves: No cranial nerve deficit.     Deep Tendon Reflexes: Reflexes are normal and symmetric.  Psychiatric:        Mood and Affect: Mood normal.          Assessment & Plan:      This visit occurred during the SARS-CoV-2 public health emergency.  Safety protocols were in place, including screening questions prior to the visit, additional usage of staff PPE, and extensive cleaning of exam room while observing appropriate contact time as indicated for disinfecting solutions.

## 2020-07-05 NOTE — Assessment & Plan Note (Signed)
Denies concerns today. °Continue to monitor. °

## 2020-07-05 NOTE — Assessment & Plan Note (Addendum)
Well controlled in the office today, continue losartan 25 mg daily.  CMP pending 

## 2020-07-05 NOTE — Assessment & Plan Note (Signed)
Compliant to atorvastatin 10 mg, continue same. Repeat lipid panel pending. 

## 2020-07-05 NOTE — Assessment & Plan Note (Signed)
A1c from November 2021 under great control. Repeat A1c pending today  Continue glipizide XL 5 mg daily. Managed on ARB and statin.  Eye exam up-to-date. Repeat A1c in 6 months.

## 2020-07-05 NOTE — Patient Instructions (Addendum)
Stop by the lab and xray prior to leaving today. I will notify you of your results once received.   [facility].   Schedule an appointment with your GYN.   It was a pleasure to see you today!  Preventive Care 66-58 Years Old, Female Preventive care refers to lifestyle choices and visits with your health care provider that can promote health and wellness. This includes: A yearly physical exam. This is also called an annual wellness visit. Regular dental and eye exams. Immunizations. Screening for certain conditions. Healthy lifestyle choices, such as: Eating a healthy diet. Getting regular exercise. Not using drugs or products that contain nicotine and tobacco. Limiting alcohol use. What can I expect for my preventive care visit? Physical exam Your health care provider will check your: Height and weight. These may be used to calculate your BMI (body mass index). BMI is a measurement that tells if you are at a healthy weight. Heart rate and blood pressure. Body temperature. Skin for abnormal spots. Counseling Your health care provider may ask you questions about your: Past medical problems. Family's medical history. Alcohol, tobacco, and drug use. Emotional well-being. Home life and relationship well-being. Sexual activity. Diet, exercise, and sleep habits. Work and work Statistician. Access to firearms. Method of birth control. Menstrual cycle. Pregnancy history. What immunizations do I need?  Vaccines are usually given at various ages, according to a schedule. Your health care provider will recommend vaccines for you based on your age, medicalhistory, and lifestyle or other factors, such as travel or where you work. What tests do I need? Blood tests Lipid and cholesterol levels. These may be checked every 5 years, or more often if you are over 81 years old. Hepatitis C test. Hepatitis B test. Screening Lung cancer screening. You  may have this screening every year starting at age 60 if you have a 30-pack-year history of smoking and currently smoke or have quit within the past 15 years. Colorectal cancer screening. All adults should have this screening starting at age 28 and continuing until age 1. Your health care provider may recommend screening at age 16 if you are at increased risk. You will have tests every 1-10 years, depending on your results and the type of screening test. Diabetes screening. This is done by checking your blood sugar (glucose) after you have not eaten for a while (fasting). You may have this done every 1-3 years. Mammogram. This may be done every 1-2 years. Talk with your health care provider about when you should start having regular mammograms. This may depend on whether you have a family history of breast cancer. BRCA-related cancer screening. This may be done if you have a family history of breast, ovarian, tubal, or peritoneal cancers. Pelvic exam and Pap test. This may be done every 3 years starting at age 65. Starting at age 5, this may be done every 5 years if you have a Pap test in combination with an HPV test. Other tests STD (sexually transmitted disease) testing, if you are at risk. Bone density scan. This is done to screen for osteoporosis. You may have this scan if you are at high risk for osteoporosis. Talk with your health care provider about your test results, treatment options,and if necessary, the need for more tests. Follow these instructions at home: Eating and drinking  Eat a diet that includes fresh fruits and vegetables, whole grains, lean protein, and low-fat dairy products. Take vitamin and mineral supplements as  recommended by your health care provider. Do not drink alcohol if: Your health care provider tells you not to drink. You are pregnant, may be pregnant, or are planning to become pregnant. If you drink alcohol: Limit how much you have to 0-1 drink a  day. Be aware of how much alcohol is in your drink. In the [place] one drink equals one 12 oz bottle of beer (355 mL), one 5 oz glass of wine (148 mL), or one 1 oz glass of hard liquor (44 mL).  Lifestyle Take daily care of your teeth and gums. Brush your teeth every morning and night with fluoride toothpaste. Floss one time each day. Stay active. Exercise for at least 30 minutes 5 or more days each week. Do not use any products that contain nicotine or tobacco, such as cigarettes, e-cigarettes, and chewing tobacco. If you need help quitting, ask your health care provider. Do not use drugs. If you are sexually active, practice safe sex. Use a condom or other form of protection to prevent STIs (sexually transmitted infections). If you do not wish to become pregnant, use a form of birth control. If you plan to become pregnant, see your health care provider for a prepregnancy visit. If told by your health care provider, take low-dose aspirin daily starting at age 9. Find healthy ways to cope with stress, such as: Meditation, yoga, or listening to music. Journaling. Talking to a trusted person. Spending time with friends and family. Safety Always wear your seat belt while driving or riding in a vehicle. Do not drive: If you have been drinking alcohol. Do not ride with someone who has been drinking. When you are tired or distracted. While texting. Wear a helmet and other protective equipment during sports activities. If you have firearms in your house, make sure you follow all gun safety procedures. What's next? Visit your health care provider once a year for an annual wellness visit. Ask your health care provider how often you should have your eyes and teeth checked. Stay up to date on all vaccines. This information is not intended to replace advice given to you by your health care provider. Make sure you discuss any questions you have with your healthcare provider. Document Revised:  09/29/2019 Document Reviewed: 09/05/2017 Elsevier Patient Education  2022 [employer].

## 2020-07-05 NOTE — Assessment & Plan Note (Signed)
Chronic and continued, checking plain films of the right hip today.  Consider orthopedic evaluation next as she declines physical therapy.

## 2020-07-06 DIAGNOSIS — B353 Tinea pedis: Secondary | ICD-10-CM

## 2020-07-07 MED ORDER — CICLOPIROX OLAMINE 0.77 % EX CREA: TOPICAL_CREAM | Freq: Two times a day (BID) | CUTANEOUS | 0 refills | Status: DC

## 2020-07-15 ENCOUNTER — Other Ambulatory Visit: Payer: Self-pay | Admitting: Primary Care

## 2020-07-15 DIAGNOSIS — E785 Hyperlipidemia, unspecified: Secondary | ICD-10-CM

## 2020-08-07 ENCOUNTER — Other Ambulatory Visit: Payer: Self-pay | Admitting: Primary Care

## 2020-08-07 DIAGNOSIS — R053 Chronic cough: Secondary | ICD-10-CM

## 2020-08-07 NOTE — Telephone Encounter (Signed)
Refills sent to pharmacy. 

## 2020-09-08 LAB — HM PAP SMEAR

## 2020-09-24 ENCOUNTER — Other Ambulatory Visit: Payer: Self-pay | Admitting: Primary Care

## 2020-09-24 DIAGNOSIS — E119 Type 2 diabetes mellitus without complications: Secondary | ICD-10-CM

## 2020-11-15 LAB — HM MAMMOGRAPHY

## 2020-12-22 ENCOUNTER — Other Ambulatory Visit: Payer: Self-pay

## 2020-12-22 ENCOUNTER — Encounter: Payer: Self-pay | Admitting: Primary Care

## 2020-12-22 ENCOUNTER — Ambulatory Visit: Payer: BC Managed Care – PPO | Admitting: Primary Care

## 2020-12-22 VITALS — BP 130/84 | HR 119 | Temp 98.1°F | Ht 64.0 in | Wt 196.0 lb

## 2020-12-22 DIAGNOSIS — E6609 Other obesity due to excess calories: Secondary | ICD-10-CM | POA: Insufficient documentation

## 2020-12-22 DIAGNOSIS — E119 Type 2 diabetes mellitus without complications: Secondary | ICD-10-CM | POA: Diagnosis not present

## 2020-12-22 DIAGNOSIS — Z6833 Body mass index (BMI) 33.0-33.9, adult: Secondary | ICD-10-CM | POA: Diagnosis not present

## 2020-12-22 LAB — POCT GLYCOSYLATED HEMOGLOBIN (HGB A1C): Hemoglobin A1C: 6.1 % — AB (ref 4.0–5.6)

## 2020-12-22 MED ORDER — TIRZEPATIDE 2.5 MG/0.5ML ~~LOC~~ SOAJ: 2.5000 mg | SUBCUTANEOUS | 0 refills | Status: DC

## 2020-12-22 NOTE — Progress Notes (Signed)
Subjective:    Patient ID: Cheyenne Flowers, female    DOB: July 07, 1960, 60 y.o.   MRN: [id number]  HPI  Kionna Brier is a very pleasant 60 y.o. female with a history of hypertension, type 2 diabetes, decreased renal function, chronic neck pain, hyperlipidemia, fatigue, chronic hip pain who presents today to discuss obesity and for follow up of diabetes.   1) Obesity:  She is interested in tying Mounjaro for weight loss. Her daughter is on this and has lost 20 pounds thus far.   She is very frustrated by her obesity for which she's struggled with for years. She has tried cutting out sweets, weight watchers, exercising, calorie counting, low carb eating without weight loss.   Diet currently consists of:  Breakfast: Oatmeal with almonds, cheese stick Lunch: Frozen dinner (healthy choice), cafeteria at work (veggies or salad) Dinner: Protein, starch, vegetables Snacks: Infrequently. Sometimes mixed nuts, cheese stick, fruit Desserts: Infrequently Beverages: Water, diet soda  Exercise: Walking some    Wt Readings from Last 3 Encounters:  12/22/20 196 lb (88.9 kg)  07/05/20 196 lb 8 oz (89.1 kg)  11/12/19 195 lb 12.8 oz (88.8 kg)   2) Type 2 Diabetes:   Current medications include: Glipizide XL 5 mg  She is checking her blood glucose 0 times daily.  Last A1C: 6.4 in June 2022, 6.1 today Last Eye Exam: UTD Last Foot Exam: Due Pneumonia Vaccination: 2019 Urine Microalbumin: None. On Losartan Statin: atorvastatin   BP Readings from Last 3 Encounters:  12/22/20 130/84  07/05/20 124/80  11/12/19 (!) 143/98        Review of Systems  Respiratory:  Negative for shortness of breath.   Cardiovascular:  Negative for chest pain.  Neurological:  Negative for dizziness, numbness and headaches.        Past Medical History:  Diagnosis Date   Acute foreign body of right ear canal 01/20/2019   Allergy    Anxiety and depression    Arthritis    Chronic neck pain     Complication of anesthesia    Diverticulosis    GERD (gastroesophageal reflux disease)    Hyperlipidemia    Hypertension    PONV (postoperative nausea and vomiting)    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HCC)    Type 2 diabetes mellitus (HCC)     Social History   Socioeconomic History   Marital status: Married    Spouse name: Not on file   Number of children: Not on file   Years of education: Not on file   Highest education level: Not on file  Occupational History   Not on file  Tobacco Use   Smoking status: Never   Smokeless tobacco: Never  Vaping Use   Vaping Use: Never used  Substance and Sexual Activity   Alcohol use: Never   Drug use: Never   Sexual activity: Not on file  Other Topics Concern   Not on file  Social History Narrative   Married.   5 children, 9 grandchildren.   Works in administration.    Enjoys watching her grandchildren, spending time with family.   Social Determinants of Health   Financial Resource Strain: Not on file  Food Insecurity: Not on file  Transportation Needs: Not on file  Physical Activity: Not on file  Stress: Not on file  Social Connections: Not on file  Intimate Partner Violence: Not on file    Past Surgical History:  Procedure Laterality Date   CERVICAL FUSION  2007  CHOLECYSTECTOMY     DG GALL BLADDER  1990   EXTERNAL EAR SURGERY  2008, 2017   ORIF HUMERUS FRACTURE Left 11/12/2019   Procedure: open reduction internal fixation of left greater tuberosity fracture;  Surgeon: Francena Hanly, MD;  Location: [facility];  Service: Orthopedics;  Laterality: Left;     Family History  Problem Relation Age of Onset   COPD Mother    Diabetes Mother    Heart disease Mother    Diabetes Brother    Hypertension Brother     Allergies  Allergen Reactions   Latex Hives and Rash   Clindamycin Rash   Codeine Nausea Only    Other reaction(s): Other (See Comments) Sick feeling    Quinolones Rash    Current Outpatient Medications on File  Prior to Visit  Medication Sig Dispense Refill   atorvastatin (LIPITOR) 10 MG tablet TAKE 1 TABLET BY MOUTH EVERY DAY for cholesterol. 90 tablet 3   cetirizine (ZYRTEC) 10 MG tablet TAKE 1 TABLET BY MOUTH EVERY DAY as needed for allergies. 90 tablet 3   ciclopirox (LOPROX) 0.77 % cream Apply topically 2 (two) times daily. 30 g 0   gabapentin (NEURONTIN) 600 MG tablet Take 1 tablet (600 mg total) by mouth at bedtime. For neck pain. 90 tablet 3   glipiZIDE (GLUCOTROL XL) 5 MG 24 hr tablet TAKE 1 TABLET (5 MG TOTAL) BY MOUTH DAILY WITH BREAKFAST. FOR DIABETES. 90 tablet 1   losartan (COZAAR) 25 MG tablet TAKE 1 TABLET BY MOUTH EVERY DAY FOR BLOOD PRESSURE 90 tablet 3   montelukast (SINGULAIR) 10 MG tablet TAKE 1 TABLET (10 MG TOTAL) BY MOUTH AT BEDTIME. FOR ALLERGIES. 90 tablet 2   nortriptyline (PAMELOR) 10 MG capsule TAKE 1 CAPSULE (10 MG TOTAL) BY MOUTH AT BEDTIME. FOR DIARRHEA. 90 capsule 3   triamcinolone cream (KENALOG) 0.1 % Apply 1 application topically 2 (two) times daily. (Patient taking differently: Apply 1 application topically 2 (two) times daily as needed (ezcema flares).) 15 g 0   No current facility-administered medications on file prior to visit.    BP 130/84    Pulse (!) 119    Temp 98.1 F (36.7 C) (Temporal)    Ht 5\' 4"  (1.626 m)    Wt 196 lb (88.9 kg)    SpO2 97%    BMI 33.64 kg/m  Objective:   Physical Exam Cardiovascular:     Rate and Rhythm: Normal rate and regular rhythm.  Pulmonary:     Effort: Pulmonary effort is normal.     Breath sounds: Normal breath sounds.  Musculoskeletal:     Cervical back: Neck supple.  Skin:    General: Skin is warm and dry.  Psychiatric:        Mood and Affect: Mood normal.          Assessment & Plan:      This visit occurred during the SARS-CoV-2 public health emergency.  Safety protocols were in place, including screening questions prior to the visit, additional usage of staff PPE, and extensive cleaning of exam room while  observing appropriate contact time as indicated for disinfecting solutions.

## 2020-12-22 NOTE — Assessment & Plan Note (Signed)
Diet isn't horrible, I did recommend increased physical activity.   We discussed the Greggory Keen is not approved for weight loss at this time, but that it is approved for T2D for which she has a history of.  Rx for Mounjaro 2.5 mg weekly sent to pharmacy. She will notify me at 4 weeks so that we can send the 5 mg pen dose.   Stop Glipizide XL 5 mg.  Follow up in 6 weeks.

## 2020-12-22 NOTE — Assessment & Plan Note (Signed)
Controlled on Glipizide XL 5 mg with A1C today of 6.1.  She would like to try [facility] which is reasonable given her diabetes history.  Rx for Mounjaro 2.5 mg weekly sent to pharmacy. She will notify me at 4 weeks so that we can send the 5 mg pen dose.   Stop Glipizide XL 5 mg.  Follow up in 6 weeks.

## 2020-12-22 NOTE — Patient Instructions (Signed)
Stop taking Glipizide XL 5 mg for diabetes.  Start taking Monjaro 2.5 mg once weekly.  Please message me online once you've completed your 4th injection so that we can start the 5 mg dose.  We will see you in 6 weeks.  It was a pleasure to see you today!

## 2020-12-29 DIAGNOSIS — Z6833 Body mass index (BMI) 33.0-33.9, adult: Secondary | ICD-10-CM

## 2020-12-29 DIAGNOSIS — E119 Type 2 diabetes mellitus without complications: Secondary | ICD-10-CM

## 2021-01-03 NOTE — Telephone Encounter (Signed)
Denied for following did you want to try one of the alternatives?   Coverage for this medication is denied for the following reason(s). We reviewed the information we received about your condition and circumstances. We used plan approved criteria when making this decision. The policy states that this medication may be approved when: -The member is unable to take the required number of formulary alternatives for the given diagnosis due to an intolerance or contraindication OR -The member has tried and failed the required number of formulary alternatives. Based on the policy and the information we received your request is denied. We did not receive documentation that you meet the criteria outlined above. Formulary alternatives are: Ozempic, Rybelsus, Trulicity, Victoza. (Requirement: 3 in a class with 3 or more alternatives, 2 in a class with 2 alternatives, or 1 in a class with only 1 alternative.) Note: Formulary alternatives may require a prior authorization. Your prescriber will be responsible for determining what alternative is appropriate for you.

## 2021-01-04 MED ORDER — OZEMPIC (0.25 OR 0.5 MG/DOSE) 2 MG/1.5ML ~~LOC~~ SOPN: PEN_INJECTOR | SUBCUTANEOUS | 0 refills | Status: DC

## 2021-01-04 NOTE — Addendum Note (Signed)
Addended by: Doreene Nest on: 01/04/2021 07:50 PM   Modules accepted: Orders

## 2021-02-02 ENCOUNTER — Ambulatory Visit: Payer: BC Managed Care – PPO | Admitting: Primary Care

## 2021-02-14 ENCOUNTER — Ambulatory Visit: Payer: BC Managed Care – PPO | Admitting: Primary Care

## 2021-02-17 ENCOUNTER — Other Ambulatory Visit: Payer: Self-pay

## 2021-02-17 ENCOUNTER — Ambulatory Visit: Payer: BC Managed Care – PPO | Admitting: Primary Care

## 2021-02-17 ENCOUNTER — Encounter: Payer: Self-pay | Admitting: Primary Care

## 2021-02-17 VITALS — BP 128/82 | HR 102 | Temp 98.0°F | Ht 64.0 in | Wt 182.0 lb

## 2021-02-17 DIAGNOSIS — E1165 Type 2 diabetes mellitus with hyperglycemia: Secondary | ICD-10-CM

## 2021-02-17 MED ORDER — SEMAGLUTIDE (1 MG/DOSE) 4 MG/3ML ~~LOC~~ SOPN: 1.0000 mg | PEN_INJECTOR | SUBCUTANEOUS | 0 refills | Status: DC

## 2021-02-17 NOTE — Patient Instructions (Signed)
We increased your Ozempic to 1 mg. Inject once weekly into the skin.  Continue to work on your diet and exercise regularly.  It was a pleasure to see you today!

## 2021-02-17 NOTE — Assessment & Plan Note (Addendum)
Weight loss of 14 pounds since last visit!!!  Commended her on improving her diet and exercising.   Agree to increase the dose of Ozempic to 1 mg weekly. We will see her back in early April to check A1C and for further follow up.  Remain off Glipizide XL 10 mg.

## 2021-02-17 NOTE — Progress Notes (Signed)
Subjective:    Patient ID: Cheyenne Flowers, female    DOB: 1960/05/24, 61 y.o.   MRN: [id number]  HPI  Cheyenne Flowers is a very pleasant 61 y.o. female with a history of type 2 diabetes, hypertension, hyperlipidemia, fatigue who presents today for follow up of obesity.  She was last evaluated on 12/22/20 for follow up of diabetes and to discuss obesity. During this visit she endorsed frustration in her weight despite numerous efforts to lose weight. She requested to try [facility] which was prescribed at 2.5 mg weekly with plans to increase to 5 mg weekly after one month. She is here today for follow up.  Since her last visit she's been exercising four days weekly at work. She's improved her diet by reducing portion sizes, adding protein shakes, increased lean protein, increased vegetables and fruit. She is feeling so much better!  She denies GI upset and increased heartburn symptoms. She is now up to 0.5 mg weekly, just completed her second pen. Would like to increase to the next dose.  She is not taking her Glipizide XL for diabetes.   Wt Readings from Last 3 Encounters:  02/17/21 182 lb (82.6 kg)  12/22/20 196 lb (88.9 kg)  07/05/20 196 lb 8 oz (89.1 kg)      Review of Systems  Eyes:  Negative for visual disturbance.  Respiratory:  Negative for shortness of breath.   Cardiovascular:  Negative for chest pain.  Neurological:  Negative for numbness.        Past Medical History:  Diagnosis Date   Acute foreign body of right ear canal 01/20/2019   Allergy    Anxiety and depression    Arthritis    Chronic neck pain    Complication of anesthesia    Diverticulosis    GERD (gastroesophageal reflux disease)    Hyperlipidemia    Hypertension    PONV (postoperative nausea and vomiting)    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[place])    Type 2 diabetes mellitus ([place])     Social History   Socioeconomic History   Marital status: Married    Spouse name: Not on file   Number of children: Not on  file   Years of education: Not on file   Highest education level: Not on file  Occupational History   Not on file  Tobacco Use   Smoking status: Never   Smokeless tobacco: Never  Vaping Use   Vaping Use: Never used  Substance and Sexual Activity   Alcohol use: Never   Drug use: Never   Sexual activity: Not on file  Other Topics Concern   Not on file  Social History Narrative   Married.   5 children, 9 grandchildren.   Works in administration.    Enjoys watching her grandchildren, spending time with family.   Social Determinants of Health   Financial Resource Strain: Not on file  Food Insecurity: Not on file  Transportation Needs: Not on file  Physical Activity: Not on file  Stress: Not on file  Social Connections: Not on file  Intimate Partner Violence: Not on file    Past Surgical History:  Procedure Laterality Date   CERVICAL FUSION  2007   [place]  2008, 2017   ORIF HUMERUS FRACTURE Left 11/12/2019   Procedure: open reduction internal fixation of left greater tuberosity fracture;  Surgeon: Justice Britain, MD;  Location: [facility];  Service: Orthopedics;  Laterality:  Left;  121min    Family History  Problem Relation Age of Onset   COPD Mother    Diabetes Mother    Heart disease Mother    Diabetes Brother    Hypertension Brother     Allergies  Allergen Reactions   Latex Hives and Rash   Clindamycin Rash   Codeine Nausea Only    Other reaction(s): Other (See Comments) Sick feeling    Quinolones Rash    Current Outpatient Medications on File Prior to Visit  Medication Sig Dispense Refill   atorvastatin (LIPITOR) 10 MG tablet TAKE 1 TABLET BY MOUTH EVERY DAY for cholesterol. 90 tablet 3   cetirizine (ZYRTEC) 10 MG tablet TAKE 1 TABLET BY MOUTH EVERY DAY as needed for allergies. 90 tablet 3   gabapentin (NEURONTIN) 600 MG tablet Take 1 tablet (600 mg total) by mouth at bedtime. For neck pain. 90 tablet  3   losartan (COZAAR) 25 MG tablet TAKE 1 TABLET BY MOUTH EVERY DAY FOR BLOOD PRESSURE 90 tablet 3   montelukast (SINGULAIR) 10 MG tablet TAKE 1 TABLET (10 MG TOTAL) BY MOUTH AT BEDTIME. FOR ALLERGIES. 90 tablet 2   nortriptyline (PAMELOR) 10 MG capsule TAKE 1 CAPSULE (10 MG TOTAL) BY MOUTH AT BEDTIME. FOR DIARRHEA. 90 capsule 3   Semaglutide,0.25 or 0.5MG /DOS, (OZEMPIC, 0.25 OR 0.5 MG/DOSE,) 2 MG/1.5ML SOPN Inject 0.25 mg into the skin once weekly for 4 weeks, then increase to 0.5 mg once weekly thereafter for diabetes. (Patient taking differently: 0.5 mg. 0.5 mg once weekly diabetes.) 4.5 mL 0   triamcinolone cream (KENALOG) 0.1 % Apply 1 application topically 2 (two) times daily. (Patient taking differently: Apply 1 application topically 2 (two) times daily as needed (ezcema flares).) 15 g 0   ciclopirox (LOPROX) 0.77 % cream Apply topically 2 (two) times daily. (Patient not taking: Reported on 02/17/2021) 30 g 0   No current facility-administered medications on file prior to visit.    BP 128/82    Pulse (!) 102    Temp 98 F (36.7 C) (Temporal)    Ht 5\' 4"  (1.626 m)    Wt 182 lb (82.6 kg)    SpO2 98%    BMI 31.24 kg/m  Objective:   Physical Exam Cardiovascular:     Rate and Rhythm: Normal rate and regular rhythm.  Pulmonary:     Effort: Pulmonary effort is normal.     Breath sounds: Normal breath sounds.  Musculoskeletal:     Cervical back: Neck supple.  Skin:    General: Skin is warm and dry.          Assessment & Plan:      This visit occurred during the SARS-CoV-2 public health emergency.  Safety protocols were in place, including screening questions prior to the visit, additional usage of staff PPE, and extensive cleaning of exam room while observing appropriate contact time as indicated for disinfecting solutions.

## 2021-04-04 ENCOUNTER — Other Ambulatory Visit: Payer: Self-pay

## 2021-04-04 ENCOUNTER — Encounter: Payer: Self-pay | Admitting: Primary Care

## 2021-04-04 ENCOUNTER — Ambulatory Visit: Payer: BC Managed Care – PPO | Admitting: Primary Care

## 2021-04-04 VITALS — BP 124/74 | HR 76 | Temp 98.6°F | Ht 64.0 in | Wt 175.0 lb

## 2021-04-04 DIAGNOSIS — E1165 Type 2 diabetes mellitus with hyperglycemia: Secondary | ICD-10-CM

## 2021-04-04 DIAGNOSIS — E119 Type 2 diabetes mellitus without complications: Secondary | ICD-10-CM

## 2021-04-04 DIAGNOSIS — E6609 Other obesity due to excess calories: Secondary | ICD-10-CM | POA: Diagnosis not present

## 2021-04-04 DIAGNOSIS — Z6830 Body mass index (BMI) 30.0-30.9, adult: Secondary | ICD-10-CM

## 2021-04-04 LAB — POCT GLYCOSYLATED HEMOGLOBIN (HGB A1C): Hemoglobin A1C: 5.6 % (ref 4.0–5.6)

## 2021-04-04 NOTE — Assessment & Plan Note (Signed)
Significant improvement with total weight loss of 21 pounds since December 2022! ? ?Encouraged her to continue to work on her diet, encouraged regular exercise. ? ?Continue to monitor. ?Continue Ozempic 1 mg weekly. ?

## 2021-04-04 NOTE — Patient Instructions (Signed)
Continue Ozempic 1 mg weekly. ? ?Start exercising. You should be getting 150 minutes of moderate intensity exercise weekly. ? ?It was a pleasure to see you today! ? ?

## 2021-04-04 NOTE — Progress Notes (Signed)
? ?Subjective:  ? ? Patient ID: Cheyenne Flowers, female    DOB: 23-Aug-1960, 61 y.o.   MRN: [id number] ? ?HPI ? ?Cheyenne Flowers is a very pleasant 61 y.o. female with a history of type 2 diabetes, chronic diarrhea, hyperlipidemia, fatigue, class I obesity who presents today for follow up of diabetes and obesity. ? ?She was last evaluated in February 2023, weight loss of 14 pounds since December 2022.  Currently managed on Ozempic 1 mg weekly diabetes and obesity. ? ?Since her last visit she's lost an additional 6 pounds! She is working on her diet, continues to eliminate sweets. She has increased water intake. She is not exercising much.  ? ?She does have occasional GERD, relieved with TUMs.  ? ?BP Readings from Last 3 Encounters:  ?04/04/21 124/74  ?02/17/21 128/82  ?12/22/20 130/84  ? ?Wt Readings from Last 3 Encounters:  ?04/04/21 175 lb (79.4 kg)  ?02/17/21 182 lb (82.6 kg)  ?12/22/20 196 lb (88.9 kg)  ? ? ? ? ?Review of Systems  ?Respiratory:  Negative for shortness of breath.   ?Cardiovascular:  Negative for chest pain.  ?Gastrointestinal:  Negative for abdominal pain and constipation.  ? ?   ? ? ?Past Medical History:  ?Diagnosis Date  ? Acute foreign body of right ear canal 01/20/2019  ? Allergy   ? Anxiety and depression   ? Arthritis   ? Chronic neck pain   ? Complication of anesthesia   ? Diverticulosis   ? GERD (gastroesophageal reflux disease)   ? Hyperlipidemia   ? Hypertension   ? PONV (postoperative nausea and vomiting)   ? 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[facility])   ? Type 2 diabetes mellitus ([facility])   ? ? ?Social History  ? ?Socioeconomic History  ? Marital status: Married  ?  Spouse name: Not on file  ? Number of children: Not on file  ? Years of education: Not on file  ? Highest education level: Not on file  ?Occupational History  ? Not on file  ?Tobacco Use  ? Smoking status: Never  ? Smokeless tobacco: Never  ?Vaping Use  ? Vaping Use: Never used  ?Substance and Sexual Activity  ? Alcohol use: Never  ? Drug use:  Never  ? Sexual activity: Not on file  ?Other Topics Concern  ? Not on file  ?Social History Narrative  ? Married.  ? 5 children, 9 grandchildren.  ? Works in administration.   ? Enjoys watching her grandchildren, spending time with family.  ? ?Social Determinants of Health  ? ?Financial Resource Strain: Not on file  ?Food Insecurity: Not on file  ?Transportation Needs: Not on file  ?Physical Activity: Not on file  ?Stress: Not on file  ?Social Connections: Not on file  ?Intimate Partner Violence: Not on file  ? ? ?Past Surgical History:  ?Procedure Laterality Date  ? CERVICAL FUSION  2007  ? CHOLECYSTECTOMY    ? DG GALL BLADDER  1990  ? EXTERNAL EAR SURGERY  2008, 2017  ? ORIF HUMERUS FRACTURE Left 11/12/2019  ? Procedure: open reduction internal fixation of left greater tuberosity fracture;  Surgeon: Francena Hanly, MD;  Location: [facility];  Service: Orthopedics;  Laterality: Left;   ? ? ?Family History  ?Problem Relation Age of Onset  ? COPD Mother   ? Diabetes Mother   ? Heart disease Mother   ? Diabetes Brother   ? Hypertension Brother   ? ? ?Allergies  ?Allergen Reactions  ? Latex Hives and Rash  ?  Clindamycin Rash  ? Codeine Nausea Only  ?  Other reaction(s): Other (See Comments) ?Sick feeling ?  ? Quinolones Rash  ? ? ?Current Outpatient Medications on File Prior to Visit  ?Medication Sig Dispense Refill  ? atorvastatin (LIPITOR) 10 MG tablet TAKE 1 TABLET BY MOUTH EVERY DAY for cholesterol. 90 tablet 3  ? cetirizine (ZYRTEC) 10 MG tablet TAKE 1 TABLET BY MOUTH EVERY DAY as needed for allergies. 90 tablet 3  ? gabapentin (NEURONTIN) 600 MG tablet Take 1 tablet (600 mg total) by mouth at bedtime. For neck pain. 90 tablet 3  ? losartan (COZAAR) 25 MG tablet TAKE 1 TABLET BY MOUTH EVERY DAY FOR BLOOD PRESSURE 90 tablet 3  ? montelukast (SINGULAIR) 10 MG tablet TAKE 1 TABLET (10 MG TOTAL) BY MOUTH AT BEDTIME. FOR ALLERGIES. 90 tablet 2  ? nortriptyline (PAMELOR) 10 MG capsule TAKE 1 CAPSULE (10 MG TOTAL) BY  MOUTH AT BEDTIME. FOR DIARRHEA. 90 capsule 3  ? Semaglutide, 1 MG/DOSE, 4 MG/3ML SOPN Inject 1 mg as directed once a week. For diabetes. 9 mL 0  ? triamcinolone cream (KENALOG) 0.1 % Apply 1 application topically 2 (two) times daily. (Patient taking differently: Apply 1 application. topically 2 (two) times daily as needed (ezcema flares).) 15 g 0  ? ciclopirox (LOPROX) 0.77 % cream Apply topically 2 (two) times daily. (Patient not taking: Reported on 04/04/2021) 30 g 0  ? ?No current facility-administered medications on file prior to visit.  ? ? ?BP 124/74   Pulse 76   Temp 98.6 ?F (37 ?C) (Oral)   Ht 5\' 4"  (1.626 m)   Wt 175 lb (79.4 kg)   SpO2 97%   BMI 30.04 kg/m?  ?Objective:  ? Physical Exam ?Cardiovascular:  ?   Rate and Rhythm: Normal rate and regular rhythm.  ?Pulmonary:  ?   Effort: Pulmonary effort is normal.  ?   Breath sounds: Normal breath sounds.  ?Musculoskeletal:  ?   Cervical back: Neck supple.  ?Skin: ?   General: Skin is warm and dry.  ? ? ? ? ? ?   ?Assessment & Plan:  ? ? ? ? ?This visit occurred during the SARS-CoV-2 public health emergency.  Safety protocols were in place, including screening questions prior to the visit, additional usage of staff PPE, and extensive cleaning of exam room while observing appropriate contact time as indicated for disinfecting solutions.  ?

## 2021-04-04 NOTE — Assessment & Plan Note (Signed)
Under excellent control with A1c today of 5.6! ? ?Continue Ozempic 1 mg weekly. ? ?Follow-up in 3 months. ?

## 2021-06-10 ENCOUNTER — Other Ambulatory Visit: Payer: Self-pay | Admitting: Primary Care

## 2021-06-10 DIAGNOSIS — E1165 Type 2 diabetes mellitus with hyperglycemia: Secondary | ICD-10-CM

## 2021-06-14 ENCOUNTER — Ambulatory Visit: Payer: BC Managed Care – PPO | Admitting: Primary Care

## 2021-06-14 ENCOUNTER — Encounter: Payer: Self-pay | Admitting: Primary Care

## 2021-06-14 VITALS — BP 120/82 | HR 95 | Temp 97.2°F | Ht 64.0 in | Wt 166.0 lb

## 2021-06-14 DIAGNOSIS — R229 Localized swelling, mass and lump, unspecified: Secondary | ICD-10-CM

## 2021-06-14 NOTE — Assessment & Plan Note (Signed)
Skin mass to digit representative of cyst. She sees dermatology and will discuss options.  Skin mass to lower sternum could be cyst. Doesn't feel like hiatal hernia.  Will obtain soft tissue US given that this is a new finding.   Orders placed.  Await results.

## 2021-06-14 NOTE — Patient Instructions (Signed)
You will be contacted regarding your ultrasound.  Please let us know if you have not been contacted within two weeks.   It was a pleasure to see you today!  

## 2021-06-14 NOTE — Progress Notes (Signed)
Subjective:    Patient ID: Cheyenne Flowers, female    DOB: February 11, 1960, 61 y.o.   MRN: [id number]  HPI  Enrique Weiss is a very pleasant 61 y.o. female with history of hypertension, type 2 diabetes, decreased renal function, vitamin D deficiency who presents today to discuss skin masses.  Her first skin mass is located to the right second digit at the medial DIP joint. She first noticed this about 5 months ago. She denies pain, drainage, surrounding erythema, significant change in size. 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   Her second skin mass is noted to the distal sternal region of the chest/proximal to epigastric region. She noticed this about 1-2 weeks ago. She denies pain, swelling, erythema, nausea. Years ago she was told that she had a hiatal hernia, this doesn't feel similar.    Review of Systems  Gastrointestinal:  Negative for abdominal pain and nausea.  Skin:  Negative for color change and wound.       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   5 children, 9 grandchildren.   Works in administration.    Enjoys watching her grandchildren, spending time with family.   Social Determinants of Health   Financial Resource Strain: Not on file  Food Insecurity: Not on file  Transportation Needs: Not on file  Physical Activity: Not on file  Stress: Not on file  Social Connections: Not on file  Intimate Partner Violence: Not on file    Past Surgical History:  Procedure Laterality Date   CERVICAL FUSION  2007   CHOLECYSTECTOMY     DG GALL BLADDER  1990   EXTERNAL EAR SURGERY  2008, 2017   ORIF HUMERUS FRACTURE Left 11/12/2019   Procedure: open reduction internal fixation of left greater tuberosity fracture;  Surgeon: Francena Hanly, MD;  Location: [facility];  Service: Orthopedics;  Laterality: Left;     Family History  Problem Relation Age of Onset   COPD Mother    Diabetes Mother    Heart disease Mother    Diabetes Brother    Hypertension Brother     Allergies  Allergen Reactions   Latex Hives and Rash   Clindamycin Rash   Codeine Nausea Only  Other reaction(s): Other (See Comments) Sick feeling    Quinolones Rash    Current Outpatient Medications on File Prior to Visit  Medication Sig Dispense Refill   atorvastatin (LIPITOR) 10 MG tablet TAKE 1 TABLET BY MOUTH EVERY DAY for cholesterol. 90 tablet 3   cetirizine (ZYRTEC) 10 MG tablet TAKE 1 TABLET BY MOUTH EVERY DAY as needed for allergies. 90 tablet 3   ciclopirox (LOPROX) 0.77 % cream Apply topically 2 (two) times daily. 30 g 0   gabapentin (NEURONTIN) 600 MG tablet Take 1 tablet (600 mg total) by mouth at bedtime. For neck pain. 90 tablet 3   losartan (COZAAR) 25 MG tablet TAKE 1 TABLET BY MOUTH EVERY DAY FOR BLOOD PRESSURE 90 tablet 3   montelukast (SINGULAIR) 10 MG tablet TAKE 1 TABLET (10 MG TOTAL) BY MOUTH AT BEDTIME. FOR ALLERGIES. 90 tablet 2   nortriptyline (PAMELOR) 10 MG capsule TAKE 1 CAPSULE (10 MG  TOTAL) BY MOUTH AT BEDTIME. FOR DIARRHEA. 90 capsule 3   Semaglutide, 1 MG/DOSE, 4 MG/3ML SOPN Inject 1 mg as directed once a week. For diabetes. 9 mL 0   triamcinolone cream (KENALOG) 0.1 % Apply 1 application topically 2 (two) times daily. (Patient taking differently: Apply 1 application. topically 2 (two) times daily as needed (ezcema flares).) 15 g 0   No current facility-administered medications on file prior to visit.    BP 120/82 (BP Location: Left Arm, Patient Position: Sitting, Cuff Size: Normal)   Pulse 95   Temp (!) 97.2 F (36.2 C) (Temporal)   Ht 5\' 4"  (1.626 m)   Wt 166 lb (75.3 kg)   SpO2 100%   BMI 28.49 kg/m  Objective:   Physical Exam Constitutional:      General: She is not in acute distress. Pulmonary:     Effort: Pulmonary effort is normal.  Skin:    General: Skin is warm and dry.     Comments: 2-3 mm rounded, raised, flesh colored mass to right second digit at medial DIP joint.  2.5 cm rounded, subcutaneous, flesh colored mass to right distal sternum/proximal epigastric region. Non tender. Immobile.           Assessment & Plan:   Problem List Items Addressed This Visit   None Visit Diagnoses     Localized skin mass, lump, or swelling    -  Primary   Relevant Orders   CHEST SOFT TISSUE          [place], NP

## 2021-06-18 ENCOUNTER — Other Ambulatory Visit: Payer: Self-pay | Admitting: Primary Care

## 2021-06-18 DIAGNOSIS — R053 Chronic cough: Secondary | ICD-10-CM

## 2021-06-21 ENCOUNTER — Other Ambulatory Visit: Payer: Self-pay | Admitting: Primary Care

## 2021-06-21 DIAGNOSIS — G8929 Other chronic pain: Secondary | ICD-10-CM

## 2021-06-21 DIAGNOSIS — K529 Noninfective gastroenteritis and colitis, unspecified: Secondary | ICD-10-CM

## 2021-06-26 ENCOUNTER — Ambulatory Visit
Admission: RE | Admit: 2021-06-26 | Discharge: 2021-06-26 | Disposition: A | Discharge status: Home / Self Care | Payer: BC Managed Care – PPO | Source: Ambulatory Visit | Attending: Primary Care | Admitting: Primary Care

## 2021-06-26 DIAGNOSIS — R229 Localized swelling, mass and lump, unspecified: Secondary | ICD-10-CM | POA: Diagnosis present

## 2021-06-28 ENCOUNTER — Other Ambulatory Visit: Payer: Self-pay | Admitting: Primary Care

## 2021-06-28 DIAGNOSIS — R229 Localized swelling, mass and lump, unspecified: Secondary | ICD-10-CM

## 2021-07-04 ENCOUNTER — Ambulatory Visit
Admission: RE | Admit: 2021-07-04 | Discharge: 2021-07-04 | Disposition: A | Discharge status: Home / Self Care | Payer: BC Managed Care – PPO | Source: Ambulatory Visit | Attending: Primary Care | Admitting: Primary Care

## 2021-07-04 DIAGNOSIS — R229 Localized swelling, mass and lump, unspecified: Secondary | ICD-10-CM | POA: Insufficient documentation

## 2021-07-04 MED ORDER — GADOBUTROL 1 MMOL/ML IV SOLN
7.5000 mL | Freq: Once | INTRAVENOUS | Status: AC | PRN
  Administered 2021-07-04: 7.5 mL via INTRAVENOUS

## 2021-07-05 ENCOUNTER — Other Ambulatory Visit: Payer: Self-pay | Admitting: Primary Care

## 2021-07-05 ENCOUNTER — Telehealth: Payer: Self-pay | Admitting: Primary Care

## 2021-07-05 DIAGNOSIS — I10 Essential (primary) hypertension: Secondary | ICD-10-CM

## 2021-07-05 DIAGNOSIS — E785 Hyperlipidemia, unspecified: Secondary | ICD-10-CM

## 2021-07-05 NOTE — Telephone Encounter (Signed)
Pt requesting call back about MRI results once we receive and they are reviewed

## 2021-07-07 ENCOUNTER — Encounter: Payer: Self-pay | Admitting: Primary Care

## 2021-07-07 ENCOUNTER — Ambulatory Visit (INDEPENDENT_AMBULATORY_CARE_PROVIDER_SITE_OTHER): Payer: BC Managed Care – PPO | Admitting: Primary Care

## 2021-07-07 VITALS — BP 124/64 | HR 104 | Temp 98.6°F | Ht 64.0 in | Wt 165.0 lb

## 2021-07-07 DIAGNOSIS — E6609 Other obesity due to excess calories: Secondary | ICD-10-CM

## 2021-07-07 DIAGNOSIS — K219 Gastro-esophageal reflux disease without esophagitis: Secondary | ICD-10-CM

## 2021-07-07 DIAGNOSIS — Z114 Encounter for screening for human immunodeficiency virus [HIV]: Secondary | ICD-10-CM

## 2021-07-07 DIAGNOSIS — F32A Depression, unspecified: Secondary | ICD-10-CM

## 2021-07-07 DIAGNOSIS — I10 Essential (primary) hypertension: Secondary | ICD-10-CM

## 2021-07-07 DIAGNOSIS — Z0000 Encounter for general adult medical examination without abnormal findings: Secondary | ICD-10-CM | POA: Diagnosis not present

## 2021-07-07 DIAGNOSIS — Z6830 Body mass index (BMI) 30.0-30.9, adult: Secondary | ICD-10-CM

## 2021-07-07 DIAGNOSIS — R053 Chronic cough: Secondary | ICD-10-CM

## 2021-07-07 DIAGNOSIS — G8929 Other chronic pain: Secondary | ICD-10-CM

## 2021-07-07 DIAGNOSIS — E1165 Type 2 diabetes mellitus with hyperglycemia: Secondary | ICD-10-CM

## 2021-07-07 DIAGNOSIS — K529 Noninfective gastroenteritis and colitis, unspecified: Secondary | ICD-10-CM

## 2021-07-07 DIAGNOSIS — R229 Localized swelling, mass and lump, unspecified: Secondary | ICD-10-CM

## 2021-07-07 DIAGNOSIS — E785 Hyperlipidemia, unspecified: Secondary | ICD-10-CM | POA: Diagnosis not present

## 2021-07-07 DIAGNOSIS — F419 Anxiety disorder, unspecified: Secondary | ICD-10-CM

## 2021-07-07 DIAGNOSIS — M542 Cervicalgia: Secondary | ICD-10-CM

## 2021-07-07 LAB — HEMOGLOBIN A1C: Hgb A1c MFr Bld: 5.6 % (ref 4.6–6.5)

## 2021-07-07 LAB — CBC
HCT: 41.7 % (ref 36.0–46.0)
Hemoglobin: 13.7 g/dL (ref 12.0–15.0)
MCHC: 32.8 g/dL (ref 30.0–36.0)
MCV: 93.6 fl (ref 78.0–100.0)
Platelets: 310 10*3/uL (ref 150.0–400.0)
RBC: 4.46 Mil/uL (ref 3.87–5.11)
RDW: 13.1 % (ref 11.5–15.5)
WBC: 7.1 10*3/uL (ref 4.0–10.5)

## 2021-07-07 LAB — COMPREHENSIVE METABOLIC PANEL
ALT: 15 U/L (ref 0–35)
AST: 17 U/L (ref 0–37)
Albumin: 4.2 g/dL (ref 3.5–5.2)
Alkaline Phosphatase: 85 U/L (ref 39–117)
BUN: 12 mg/dL (ref 6–23)
CO2: 30 mEq/L (ref 19–32)
Calcium: 9.5 mg/dL (ref 8.4–10.5)
Chloride: 104 mEq/L (ref 96–112)
Creatinine, Ser: 0.92 mg/dL (ref 0.40–1.20)
GFR: 68.59 mL/min (ref >60.00)
Glucose, Bld: 96 mg/dL (ref 70–99)
Potassium: 4.2 mEq/L (ref 3.5–5.1)
Sodium: 140 mEq/L (ref 135–145)
Total Bilirubin: 0.9 mg/dL (ref 0.2–1.2)
Total Protein: 7.2 g/dL (ref 6.0–8.3)

## 2021-07-07 LAB — LIPID PANEL
Cholesterol: 145 mg/dL (ref 0–200)
HDL: 44.1 mg/dL (ref >39.00)
LDL Cholesterol: 76 mg/dL (ref 0–99)
NonHDL: 100.53
Total CHOL/HDL Ratio: 3
Triglycerides: 123 mg/dL (ref 0.0–149.0)
VLDL: 24.6 mg/dL (ref 0.0–40.0)

## 2021-07-07 NOTE — Assessment & Plan Note (Signed)
Immunizations UTD. Pap smear UTD. Mammogram UTD, follows with GYN.  Colonoscopy UTD, due 2024  Discussed the importance of a healthy diet and regular exercise in order for weight loss, and to reduce the risk of further co-morbidity.  Exam stable. Labs pending.  Follow up in 1 year for repeat physical.

## 2021-07-07 NOTE — Patient Instructions (Signed)
Stop by the lab prior to leaving today. I will notify you of your results once received.   It was a pleasure to see you today!  Preventive Care 59-59 Years Old, Female Preventive care refers to lifestyle choices and visits with your health care provider that can promote health and wellness. Preventive care visits are also called wellness exams. What can I expect for my preventive care visit? Counseling Your health care provider may ask you questions about your: Medical history, including: Past medical problems. Family medical history. Pregnancy history. Current health, including: Menstrual cycle. Method of birth control. Emotional well-being. Home life and relationship well-being. Sexual activity and sexual health. Lifestyle, including: Alcohol, nicotine or tobacco, and drug use. Access to firearms. Diet, exercise, and sleep habits. Work and work Statistician. Sunscreen use. Safety issues such as seatbelt and bike helmet use. Physical exam Your health care provider will check your: Height and weight. These may be used to calculate your BMI (body mass index). BMI is a measurement that tells if you are at a healthy weight. Waist circumference. This measures the distance around your waistline. This measurement also tells if you are at a healthy weight and may help predict your risk of certain diseases, such as type 2 diabetes and high blood pressure. Heart rate and blood pressure. Body temperature. Skin for abnormal spots. What immunizations do I need?  Vaccines are usually given at various ages, according to a schedule. Your health care provider will recommend vaccines for you based on your age, medical history, and lifestyle or other factors, such as travel or where you work. What tests do I need? Screening Your health care provider may recommend screening tests for certain conditions. This may include: Lipid and cholesterol levels. Diabetes screening. This is done by checking  your blood sugar (glucose) after you have not eaten for a while (fasting). Pelvic exam and Pap test. Hepatitis B test. Hepatitis C test. HIV (human immunodeficiency virus) test. STI (sexually transmitted infection) testing, if you are at risk. Lung cancer screening. Colorectal cancer screening. Mammogram. Talk with your health care provider about when you should start having regular mammograms. This may depend on whether you have a family history of breast cancer. BRCA-related cancer screening. This may be done if you have a family history of breast, ovarian, tubal, or peritoneal cancers. Bone density scan. This is done to screen for osteoporosis. Talk with your health care provider about your test results, treatment options, and if necessary, the need for more tests. Follow these instructions at home: Eating and drinking  Eat a diet that includes fresh fruits and vegetables, whole grains, lean protein, and low-fat dairy products. Take vitamin and mineral supplements as recommended by your health care provider. Do not drink alcohol if: Your health care provider tells you not to drink. You are pregnant, may be pregnant, or are planning to become pregnant. If you drink alcohol: Limit how much you have to 0-1 drink a day. Know how much alcohol is in your drink. In the [place] one drink equals one 12 oz bottle of beer (355 mL), one 5 oz glass of wine (148 mL), or one 1 oz glass of hard liquor (44 mL). Lifestyle Brush your teeth every morning and night with fluoride toothpaste. Floss one time each day. Exercise for at least 30 minutes 5 or more days each week. Do not use any products that contain nicotine or tobacco. These products include cigarettes, chewing tobacco, and vaping devices, such as e-cigarettes. If you need  help quitting, ask your health care provider. Do not use drugs. If you are sexually active, practice safe sex. Use a condom or other form of protection to prevent STIs. If you  do not wish to become pregnant, use a form of birth control. If you plan to become pregnant, see your health care provider for a prepregnancy visit. Take aspirin only as told by your health care provider. Make sure that you understand how much to take and what form to take. Work with your health care provider to find out whether it is safe and beneficial for you to take aspirin daily. Find healthy ways to manage stress, such as: Meditation, yoga, or listening to music. Journaling. Talking to a trusted person. Spending time with friends and family. Minimize exposure to UV radiation to reduce your risk of skin cancer. Safety Always wear your seat belt while driving or riding in a vehicle. Do not drive: If you have been drinking alcohol. Do not ride with someone who has been drinking. When you are tired or distracted. While texting. If you have been using any mind-altering substances or drugs. Wear a helmet and other protective equipment during sports activities. If you have firearms in your house, make sure you follow all gun safety procedures. Seek help if you have been physically or sexually abused. What's next? Visit your health care provider once a year for an annual wellness visit. Ask your health care provider how often you should have your eyes and teeth checked. Stay up to date on all vaccines. This information is not intended to replace advice given to you by your health care provider. Make sure you discuss any questions you have with your health care provider. Document Revised: 06/22/2020 Document Reviewed: 06/22/2020 Elsevier Patient Education  [place].

## 2021-07-07 NOTE — Assessment & Plan Note (Signed)
No concerns today. Continue to monitor. 

## 2021-07-07 NOTE — Assessment & Plan Note (Addendum)
Infrequent symptoms. It is possible that she could be experiencing silent reflux which is contributing to her cough.  We discussed a trial of omeprazole 20 mg daily. Continue to monitor.

## 2021-07-07 NOTE — Assessment & Plan Note (Signed)
Controlled.  Continue nortriptyline 10 mg nightly.

## 2021-07-07 NOTE — Assessment & Plan Note (Signed)
Benign.   Reviewed recent MRI results.

## 2021-07-07 NOTE — Assessment & Plan Note (Signed)
Repeat A1C pending today.  Continue Ozempic 1 mg weekly.  Foot and eye exams UTD. Managed on statin and ARB. Pneumonia vaccine UTD.  Follow up in 6 months.

## 2021-07-07 NOTE — Assessment & Plan Note (Signed)
Continues to do well on Ozempic 1 mg weekly. No bad side effects. Repeat A1C pending.  Consider dose increase to 1.5 mg weekly. Await labs.

## 2021-07-07 NOTE — Assessment & Plan Note (Signed)
Continued.   Continue Zyrtec 10 mg daily and Singulair 10 mg HS. Start omeprazole 20 mg HS, she will update.

## 2021-07-07 NOTE — Assessment & Plan Note (Signed)
Controlled.  Continue gabapentin 600 mg HS. Continue to monitor.

## 2021-07-07 NOTE — Assessment & Plan Note (Signed)
Repeat lipid panel pending. Continue atorvastatin 10 mg daily. 

## 2021-07-07 NOTE — Assessment & Plan Note (Signed)
Controlled. ? ?Continue losartan 25 mg daily. ? ?CMP pending. ?

## 2021-07-07 NOTE — Progress Notes (Signed)
Subjective:    Patient ID: Cheyenne Flowers, female    DOB: 08/17/1960, 61 y.o.   MRN: [id number]  HPI  Cheyenne Flowers is a very pleasant 61 y.o. female who presents today for complete physical and follow up of chronic conditions.  Immunizations: -Tetanus: 2020 -Influenza: Completed last season  -Covid-19: 4 vaccines -Shingles: Completed Shingrix series -Pneumonia: 2019  Diet: [place].  Exercise: No regular exercise.  Eye exam: Completes annually  Dental exam: Completes semi-annually   Pap Smear: Completed in 2022, follows with GYN Mammogram: Completed late 2022.  Colonoscopy: Completed in 2019, due 2024  BP Readings from Last 3 Encounters:  07/07/21 124/64  06/14/21 120/82  04/04/21 124/74   Wt Readings from Last 3 Encounters:  07/07/21 165 lb (74.8 kg)  06/14/21 166 lb (75.3 kg)  04/04/21 175 lb (79.4 kg)         Review of Systems  Constitutional:  Negative for unexpected weight change.  HENT:  Positive for postnasal drip. Negative for rhinorrhea.   Eyes:  Negative for visual disturbance.  Respiratory:  Positive for cough. Negative for shortness of breath.   Cardiovascular:  Negative for chest pain.  Gastrointestinal:  Negative for constipation and diarrhea.  Genitourinary:  Negative for difficulty urinating.  Musculoskeletal:  Negative for arthralgias and myalgias.  Skin:  Negative for rash.  Allergic/Immunologic: Positive for environmental allergies.  Neurological:  Negative for dizziness, numbness and headaches.  Psychiatric/Behavioral:  The patient is not nervous/anxious.          Past Medical History:  Diagnosis Date   Acute foreign body of right ear canal 01/20/2019   Allergy    Anxiety and depression    Arthritis    Chronic neck pain    Complication of anesthesia    Diverticulosis    GERD (gastroesophageal reflux disease)    Hyperlipidemia    Hypertension    Laceration of right ear canal 01/20/2019   PONV (postoperative nausea and  vomiting)    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[place])    Type 2 diabetes mellitus ([place])     Social History   Socioeconomic History   Marital status: Married    Spouse name: Not on file   Number of children: Not on file   Years of education: Not on file   Highest education level: Not on file  Occupational History   Not on file  Tobacco Use   Smoking status: Never   Smokeless tobacco: Never  Vaping Use   Vaping Use: Never used  Substance and Sexual Activity   Alcohol use: Never   Drug use: Never   Sexual activity: Not on file  Other Topics Concern   Not on file  Social History Narrative   Married.   5 children, 9 grandchildren.   Works in administration.    Enjoys watching her grandchildren, spending time with family.   Social Determinants of Health   Financial Resource Strain: Not on file  Food Insecurity: Not on file  Transportation Needs: Not on file  Physical Activity: Not on file  Stress: Not on file  Social Connections: Not on file  Intimate Partner Violence: Not on file    Past Surgical History:  Procedure Laterality Date   CERVICAL FUSION  2007   [place]  2008, 2017   ORIF HUMERUS FRACTURE Left 11/12/2019   Procedure: open reduction internal fixation of left greater tuberosity fracture;  Surgeon: Justice Britain, MD;  Location: [facility];  Service: Orthopedics;  Laterality: Left;     Family History  Problem Relation Age of Onset   COPD Mother    Diabetes Mother    Heart disease Mother    Diabetes Brother    Hypertension Brother     Allergies  Allergen Reactions   Latex Hives and Rash   Clindamycin Rash   Codeine Nausea Only    Other reaction(s): Other (See Comments) Sick feeling    Quinolones Rash    Current Outpatient Medications on File Prior to Visit  Medication Sig Dispense Refill   atorvastatin (LIPITOR) 10 MG tablet TAKE 1 TABLET BY MOUTH EVERY DAY FOR CHOLESTEROL 90 tablet 0   cetirizine  (ZYRTEC) 10 MG tablet TAKE 1 TABLET BY MOUTH EVERY DAY as needed for allergies. 90 tablet 3   gabapentin (NEURONTIN) 600 MG tablet TAKE 1 TABLET (600 MG TOTAL) BY MOUTH AT BEDTIME. FOR NECK PAIN. 90 tablet 0   losartan (COZAAR) 25 MG tablet TAKE 1 TABLET BY MOUTH EVERY DAY FOR BLOOD PRESSURE 90 tablet 0   montelukast (SINGULAIR) 10 MG tablet TAKE 1 TABLET (10 MG TOTAL) BY MOUTH AT BEDTIME. FOR ALLERGIES. 90 tablet 0   nortriptyline (PAMELOR) 10 MG capsule TAKE 1 CAPSULE (10 MG TOTAL) BY MOUTH AT BEDTIME. FOR DIARRHEA. 90 capsule 0   OZEMPIC, 1 MG/DOSE, 4 MG/3ML SOPN Inject 1 mg under the skin as directed once a week (every 7 days) for diabetes. 9 mL 0   No current facility-administered medications on file prior to visit.    BP 124/64   Pulse (!) 104   Temp 98.6 F (37 C) (Oral)   Ht 5\' 4"  (1.626 m)   Wt 165 lb (74.8 kg)   SpO2 99%   BMI 28.32 kg/m  Objective:   Physical Exam HENT:     Right Ear: Tympanic membrane and ear canal normal.     Left Ear: Tympanic membrane and ear canal normal.     Nose: Nose normal.  Eyes:     Conjunctiva/sclera: Conjunctivae normal.     Pupils: Pupils are equal, round, and reactive to light.  Neck:     Thyroid: No thyromegaly.  Cardiovascular:     Rate and Rhythm: Normal rate and regular rhythm.     Heart sounds: No murmur heard. Pulmonary:     Effort: Pulmonary effort is normal.     Breath sounds: Normal breath sounds. No rales.  Abdominal:     General: Bowel sounds are normal.     Palpations: Abdomen is soft.     Tenderness: There is no abdominal tenderness.  Musculoskeletal:        General: Normal range of motion.     Cervical back: Neck supple.  Lymphadenopathy:     Cervical: No cervical adenopathy.  Skin:    General: Skin is warm and dry.     Findings: No rash.  Neurological:     Mental Status: She is alert and oriented to person, place, and time.     Cranial Nerves: No cranial nerve deficit.     Deep Tendon Reflexes: Reflexes are  normal and symmetric.  Psychiatric:        Mood and Affect: Mood normal.           Assessment & Plan:   Problem List Items Addressed This Visit       Cardiovascular and Mediastinum   Essential hypertension    Controlled.  Continue losartan 25 mg daily. CMP  pending.      Relevant Orders   Comprehensive metabolic panel   CBC     Digestive   GERD (gastroesophageal reflux disease)    Infrequent symptoms. It is possible that she could be experiencing silent reflux which is contributing to her cough.  We discussed a trial of omeprazole 20 mg daily. Continue to monitor.       Chronic diarrhea    Controlled.  Continue nortriptyline 10 mg nightly.        Endocrine   Type 2 diabetes mellitus with hyperglycemia (HCC)    Repeat A1C pending today.  Continue Ozempic 1 mg weekly.  Foot and eye exams UTD. Managed on statin and ARB. Pneumonia vaccine UTD.  Follow up in 6 months.      Relevant Orders   Hemoglobin A1c     Other   Anxiety and depression    No concerns today.  Continue to monitor.       Chronic neck pain    Controlled.  Continue gabapentin 600 mg HS. Continue to monitor.       Hyperlipidemia    Repeat lipid panel pending. Continue atorvastatin 10 mg daily.      Relevant Orders   Lipid panel   Preventative health care - Primary    Immunizations UTD. Pap smear UTD. Mammogram UTD, follows with GYN.  Colonoscopy UTD, due 2024  Discussed the importance of a healthy diet and regular exercise in order for weight loss, and to reduce the risk of further co-morbidity.  Exam stable. Labs pending.  Follow up in 1 year for repeat physical.       Chronic cough    Continued.   Continue Zyrtec 10 mg daily and Singulair 10 mg HS. Start omeprazole 20 mg HS, she will update.       Class 1 obesity due to excess calories with body mass index (BMI) of 30.0 to 30.9 in adult    Continues to do well on Ozempic 1 mg weekly. No bad side  effects. Repeat A1C pending.  Consider dose increase to 1.5 mg weekly. Await labs.       Localized skin mass, lump, or swelling    Benign.   Reviewed recent MRI results.       Other Visit Diagnoses     Screening for HIV (human immunodeficiency virus)       Relevant Orders   HIV antibody (with reflex)          Doreene Nest, NP

## 2021-07-08 LAB — HIV ANTIBODY (ROUTINE TESTING W REFLEX): HIV 1&2 Ab, 4th Generation: NONREACTIVE

## 2021-07-10 NOTE — Telephone Encounter (Signed)
See results notes. Per Jae Dire have reviewed via my chart.

## 2021-07-14 DIAGNOSIS — R053 Chronic cough: Secondary | ICD-10-CM

## 2021-09-12 ENCOUNTER — Other Ambulatory Visit: Payer: Self-pay | Admitting: Primary Care

## 2021-09-12 DIAGNOSIS — R053 Chronic cough: Secondary | ICD-10-CM

## 2021-09-12 DIAGNOSIS — K529 Noninfective gastroenteritis and colitis, unspecified: Secondary | ICD-10-CM

## 2021-09-19 ENCOUNTER — Other Ambulatory Visit: Payer: Self-pay | Admitting: Primary Care

## 2021-09-19 DIAGNOSIS — G8929 Other chronic pain: Secondary | ICD-10-CM

## 2021-09-25 ENCOUNTER — Other Ambulatory Visit: Payer: Self-pay | Admitting: Primary Care

## 2021-09-25 DIAGNOSIS — E1165 Type 2 diabetes mellitus with hyperglycemia: Secondary | ICD-10-CM

## 2021-10-06 ENCOUNTER — Other Ambulatory Visit: Payer: Self-pay | Admitting: Primary Care

## 2021-10-06 DIAGNOSIS — E785 Hyperlipidemia, unspecified: Secondary | ICD-10-CM

## 2021-10-06 DIAGNOSIS — I10 Essential (primary) hypertension: Secondary | ICD-10-CM

## 2021-11-05 IMAGING — RF DG C-ARM 1-60 MIN-NO REPORT
1 series · 6 of 6 positions shown · non-contrast
Comparison: None.

CLINICAL DATA: 58 y.o female. ORIF left greater tuberosity.

EXAM:
LEFT SHOULDER - 2+ VIEW; DG C-ARM 1-60 MIN-NO REPORT

[Series 1: unknown protocol · 0.20mm/px · 6 of 6 slices shown]
[im 1/6]
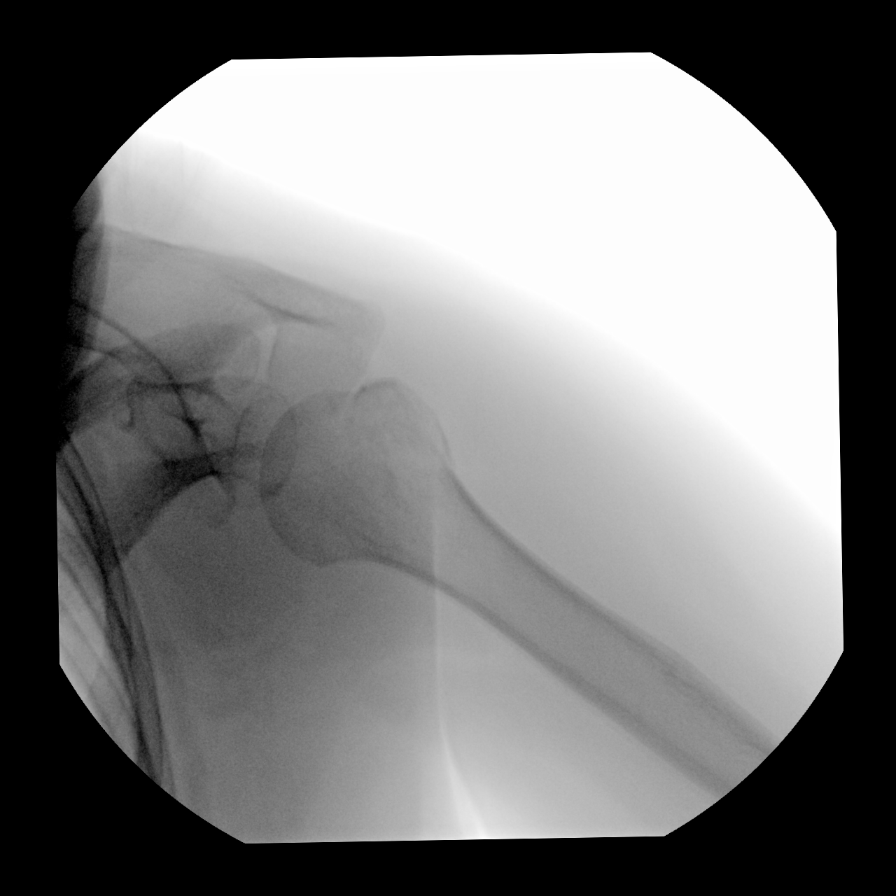
[im 2/6]
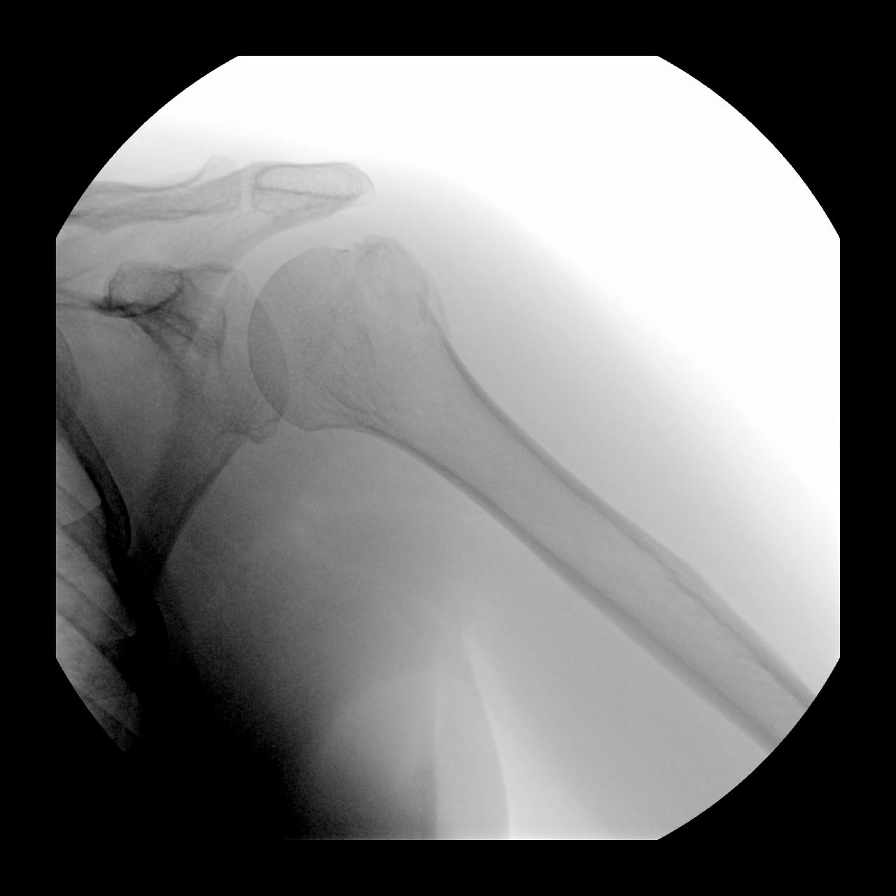
[im 3/6]
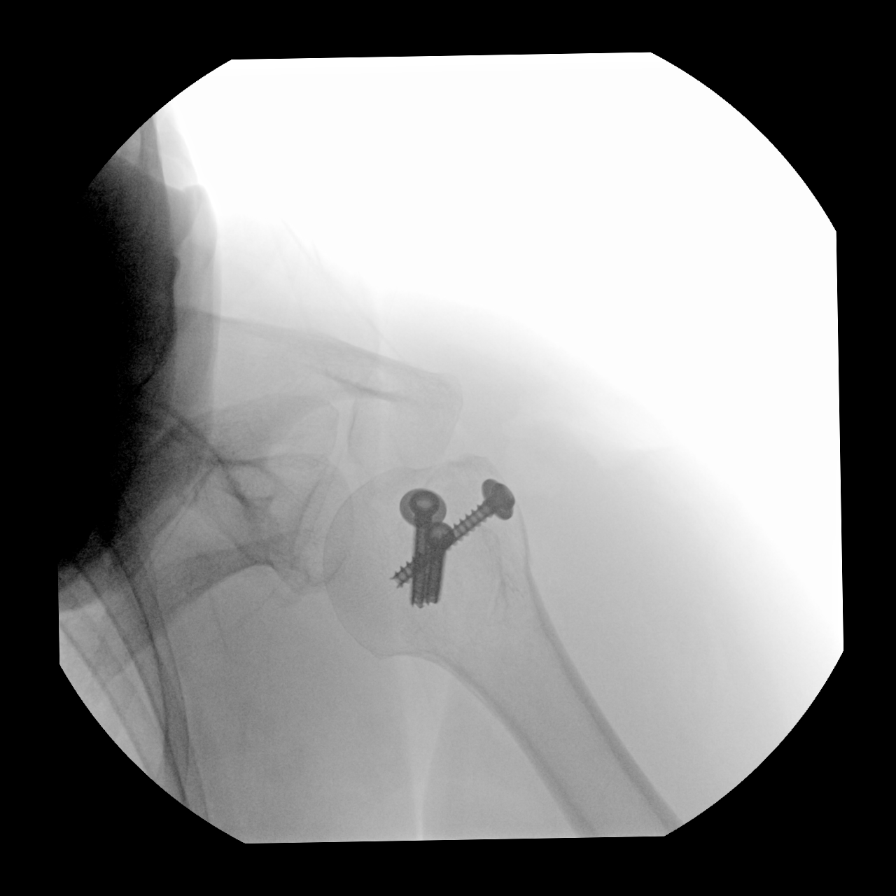
[im 4/6]
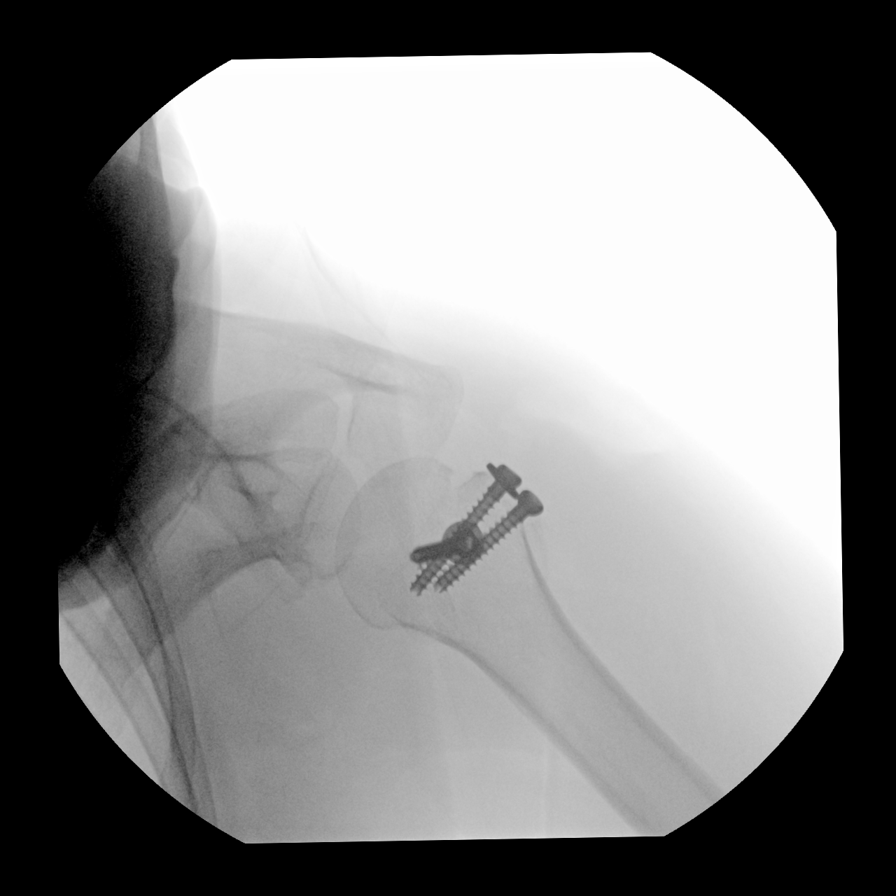
[im 5/6]
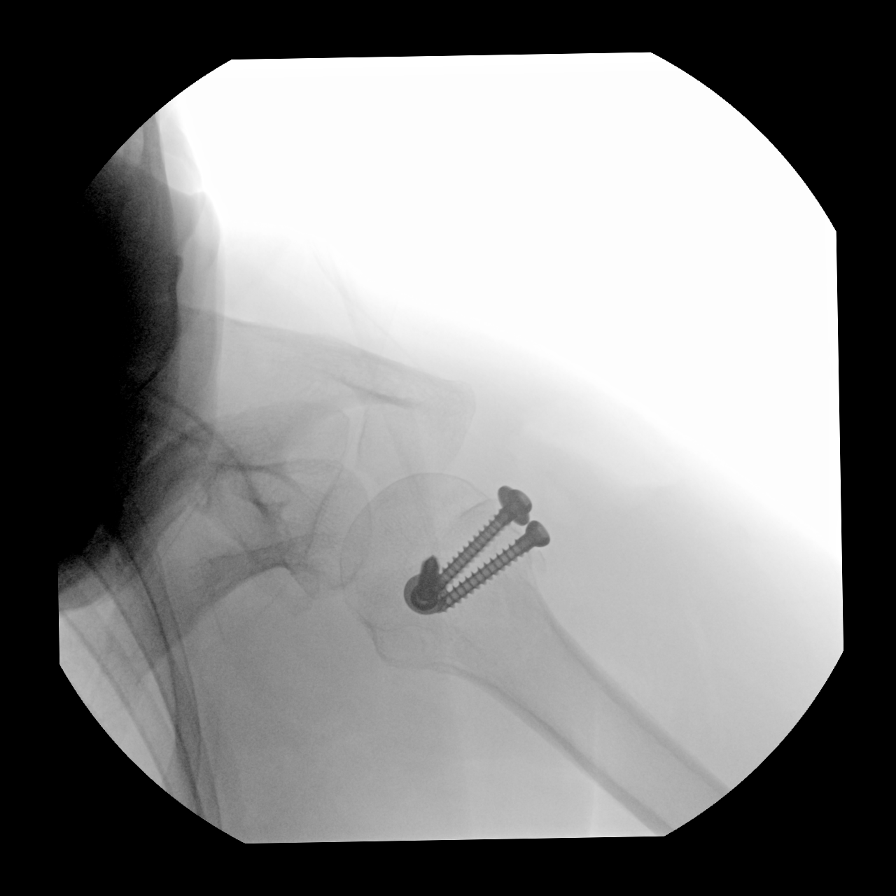
[im 6/6]
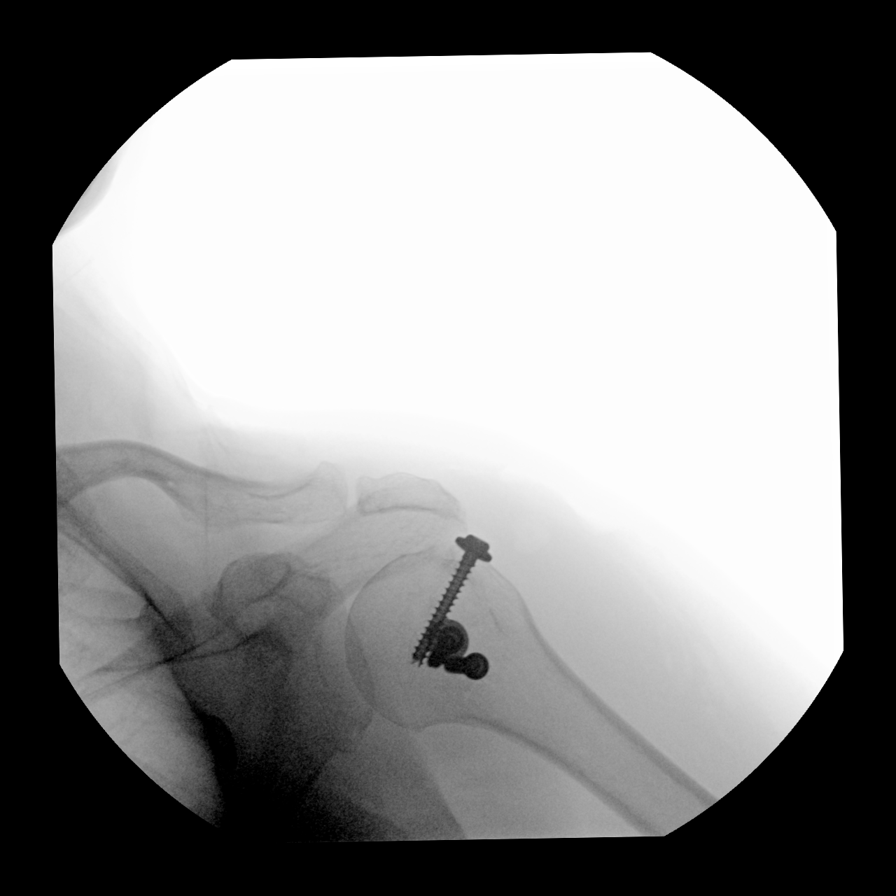

[6 of 6 positions shown; findings below may reference images not displayed]

FINDINGS: Intraoperative .

6 low resolution intraoperative spot views of the proximal left
humerus were obtained. Greater tuberosity fracture minimally
displaced status post open reduction internal fixation with 3
screws. No other fracture visible on the limited views.

Total fluoroscopy time: 1 minutes 6 seconds
IMPRESSION: Intraoperative greater tuberosity of the left humerus fixation with
3 screws.

## 2021-11-05 IMAGING — RF DG SHOULDER 2+V*L*
1 series · 6 of 6 positions shown · non-contrast
Comparison: None.

CLINICAL DATA: 58 y.o female. ORIF left greater tuberosity.

EXAM:
LEFT SHOULDER - 2+ VIEW; DG C-ARM 1-60 MIN-NO REPORT

[Series 1: unknown protocol · 0.20mm/px · 6 of 6 slices shown]
[im 1/6]
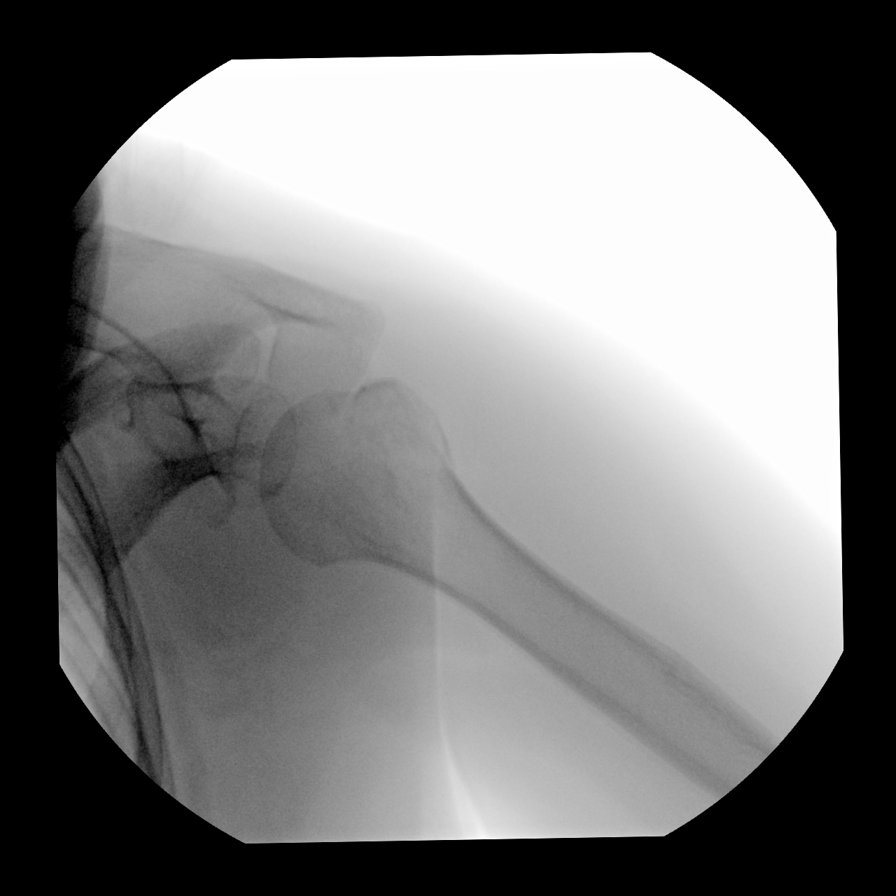
[im 2/6]
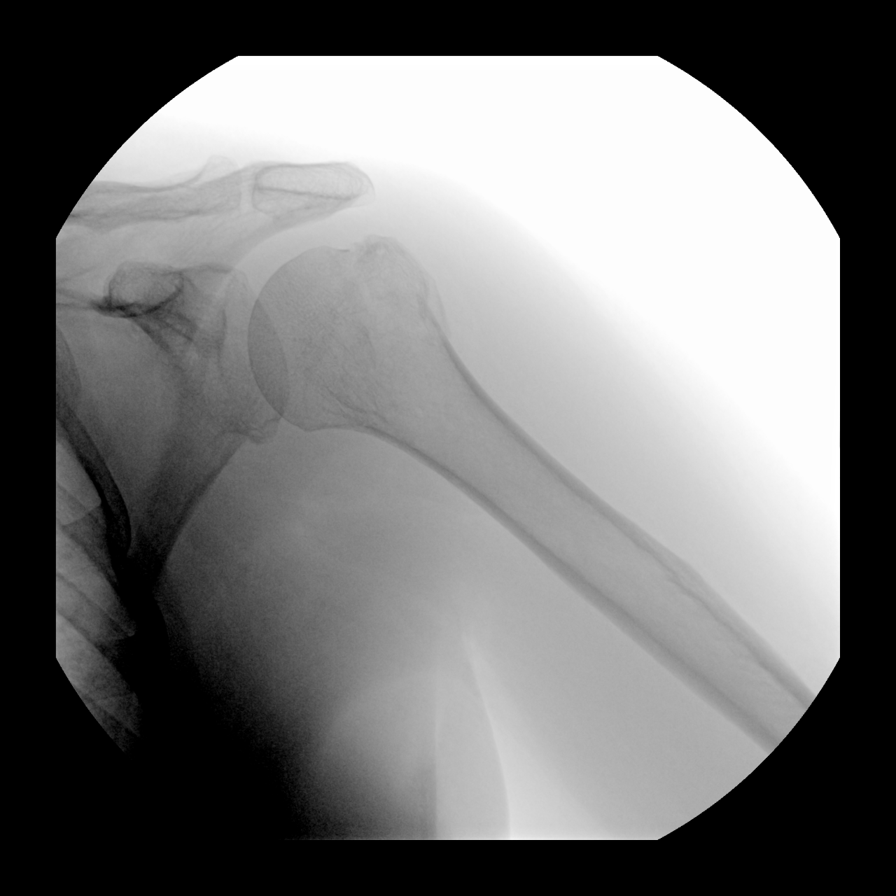
[im 3/6]
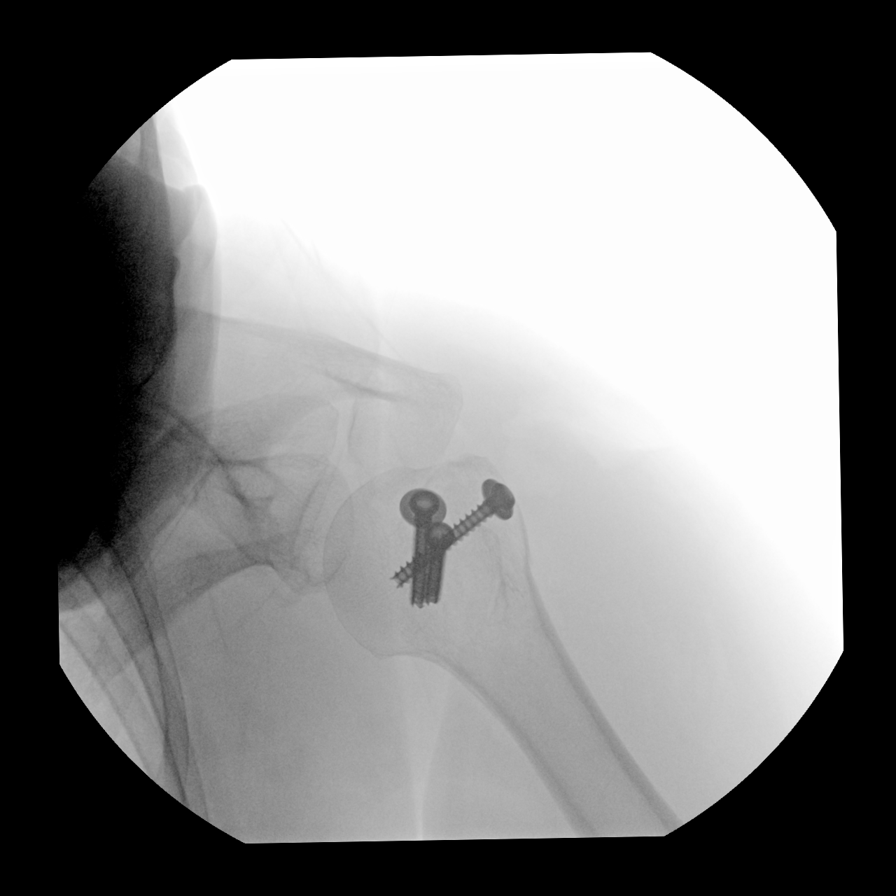
[im 4/6]
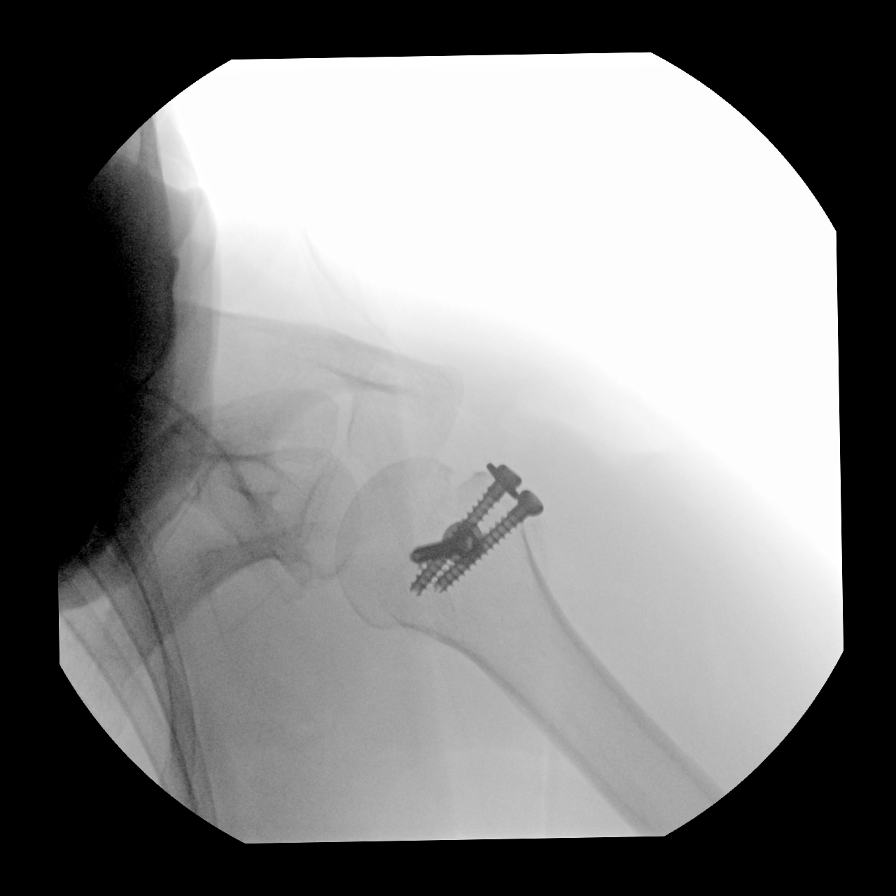
[im 5/6]
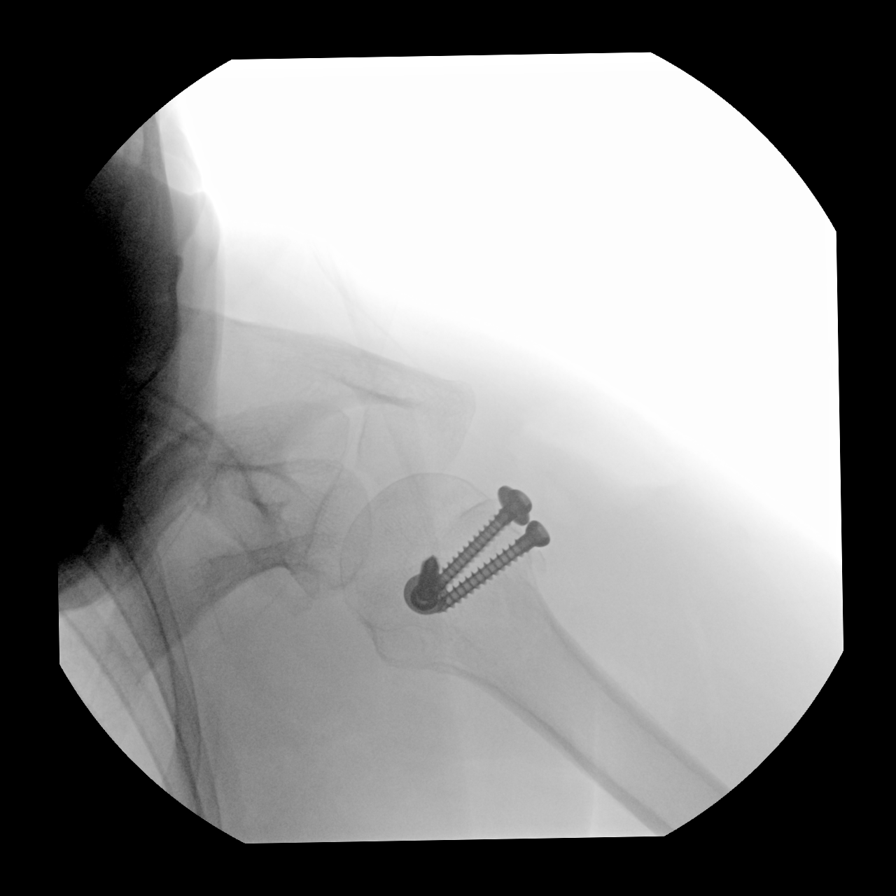
[im 6/6]
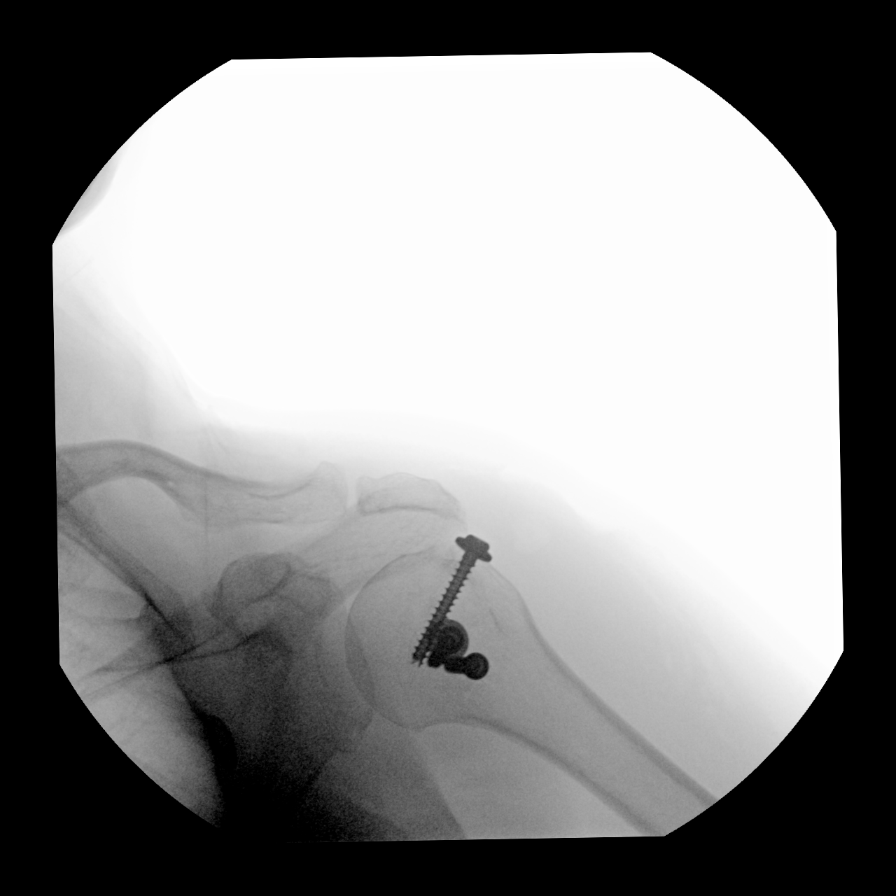

[6 of 6 positions shown; findings below may reference images not displayed]

FINDINGS: Intraoperative .

6 low resolution intraoperative spot views of the proximal left
humerus were obtained. Greater tuberosity fracture minimally
displaced status post open reduction internal fixation with 3
screws. No other fracture visible on the limited views.

Total fluoroscopy time: 1 minutes 6 seconds
IMPRESSION: Intraoperative greater tuberosity of the left humerus fixation with
3 screws.

## 2021-12-20 ENCOUNTER — Telehealth: Payer: Self-pay

## 2021-12-20 NOTE — Telephone Encounter (Signed)
Transition Care Management Follow-up Telephone Call Date of discharge and from where: [facility] 12/20/21 How have you been since you were released from the hospital? better Any questions or concerns? No  Items Reviewed: Did the pt receive and understand the discharge instructions provided? Yes  Medications obtained and verified? Yes  Other? No  Any new allergies since your discharge? No  Dietary orders reviewed? No Do you have support at home? Yes   Functional Questionnaire: (I = Independent and D = Dependent) ADLs: I  Bathing/Dressing- I  Meal Prep- I  Eating- I  Maintaining continence- I  Transferring/Ambulation- I  Managing Meds- I  Follow up appointments reviewed:  PCP Hospital f/u appt confirmed? No   Specialist Hospital f/u appt confirmed? No  . Are transportation arrangements needed? No  If their condition worsens, is the pt aware to call PCP or go to the Emergency Dept.? Yes Was the patient provided with contact information for the PCP's office or ED? Yes Was to pt encouraged to call back with questions or concerns? Yes

## 2021-12-20 NOTE — Telephone Encounter (Signed)
Per chart review pt was seen at [facility] ED on 12/19/21. Sending note to Allayne Gitelman NP.

## 2021-12-20 NOTE — Telephone Encounter (Signed)
Noted.  Patient to establish with new PCP on 12/25/2021 per notes in care everywhere.

## 2021-12-20 NOTE — Telephone Encounter (Addendum)
Per care everywhere it looks like she has an appointment on 12/25/2021 to establish with a new PCP.  Will remove my name as PCP when she has established.

## 2021-12-20 NOTE — Telephone Encounter (Signed)
[facility] Night - Client TELEPHONE ADVICE RECORD AccessNurse Patient Name: Cheyenne Flowers TMLY Gender: Female DOB: May 06, 1960 Age: 61 Y 21 D Return Phone Number: [phone] (Primary) Address: [place]/ [place]/ [place]  [address] Client [facility] Night - Client Client Site [facility] - Night Provider Vernona Rieger - NP Contact Type Call Who Is Calling Patient / Member / Family / Caregiver Call Type Triage / Clinical Relationship To Patient Self Return Phone Number [phone] (Primary) Chief Complaint Vomiting Reason for Call Symptomatic / Request for Health Information Initial Comment Caller states she is vomiting and has a headache. No blood in vomit and she has urinated in the past 8 hours. She states she also has diarrhea. Translation No Nurse Assessment Nurse: Raymon Mutton, RN, Misty Stanley Date/Time (Eastern Time): 12/19/2021 9:37:10 PM Confirm and document reason for call. If symptomatic, describe symptoms. ---Caller states she has vomiting and diarrhea along with a headache since 4:30 this morning. States she is not able to drink. Last vomiting was 4:30pm. approx 10 episodes of vomiting. She continues to have nausea. Diarrhea x 2-3 episodes with last one at 2pm. She has only had a few sips of fluid. Headache 10/10. She has not been able to take medication for the headache. No known fever. Does the patient have any new or worsening symptoms? ---Yes Will a triage be completed? ---Yes Related visit to physician within the last 2 weeks? ---No Does the PT have any chronic conditions? (i.e. diabetes, asthma, this includes High risk factors for pregnancy, etc.) ---Yes List chronic conditions. ---HTN - on ozempic for weight loss Is this a behavioral health or substance abuse call? ---No Guidelines Guideline Title Affirmed Question Affirmed Notes Nurse Date/Time (Eastern Time) Headache [1]  Vomiting AND [2] 2 or more times (Exception: Similar to previous migraines.) Raymon Mutton, RN, Misty Stanley 12/19/2021 9:43:34 PM PLEASE NOTE: All timestamps contained within this report are represented as [place] Standard Time. CONFIDENTIALTY NOTICE: This fax transmission is intended only for the addressee. It contains information that is legally privileged, confidential or otherwise protected from use or disclosure. If you are not the intended recipient, you are strictly prohibited from reviewing, disclosing, copying using or disseminating any of this information or taking any action in reliance on or regarding this information. If you have received this fax in error, please notify us immediately by telephone so that we can arrange for its return to [place]. Phone: [phone], Toll-Free: [phone], Fax: [phone] Page: 2 of 2 Call Id: [id number] Disp. Time Lamount Cohen Time) Disposition Final User 12/19/2021 9:49:11 PM See HCP within 4 Hours (or PCP triage) Yes Raymon Mutton, RN, Misty Stanley Final Disposition 12/19/2021 9:49:11 PM See HCP within 4 Hours (or PCP triage) Yes Raymon Mutton, RN, Gracy Racer Disagree/Comply Comply Caller Understands Yes PreDisposition Did not know what to do Care Advice Given Per Guideline SEE HCP (OR PCP TRIAGE) WITHIN 4 HOURS: * IF OFFICE WILL BE OPEN: You need to be seen within the next 3 or 4 hours. Call your doctor (or NP/PA) now or as soon as the office opens. CALL BACK IF: * You become worse CARE ADVICE given per Headache (Adult) guideline. Comments User: Gaylyn Lambert, RN Date/Time Lamount Cohen Time): 12/19/2021 9:43:30 PM Was able to urinate small amount 2 hours ago. Good mositure inside mouth. Referrals [facility] - E

## 2021-12-29 ENCOUNTER — Encounter: Payer: Self-pay | Admitting: Primary Care

## 2021-12-29 ENCOUNTER — Ambulatory Visit: Payer: BC Managed Care – PPO | Admitting: Primary Care

## 2021-12-29 VITALS — BP 124/82 | HR 100 | Temp 98.4°F | Ht 64.0 in | Wt 168.0 lb

## 2021-12-29 DIAGNOSIS — J020 Streptococcal pharyngitis: Secondary | ICD-10-CM

## 2021-12-29 DIAGNOSIS — R112 Nausea with vomiting, unspecified: Secondary | ICD-10-CM | POA: Insufficient documentation

## 2021-12-29 DIAGNOSIS — J029 Acute pharyngitis, unspecified: Secondary | ICD-10-CM

## 2021-12-29 DIAGNOSIS — R197 Diarrhea, unspecified: Secondary | ICD-10-CM

## 2021-12-29 HISTORY — DX: Streptococcal pharyngitis: J02.0

## 2021-12-29 HISTORY — DX: Nausea with vomiting, unspecified: R11.2

## 2021-12-29 LAB — POC COVID19 BINAXNOW: SARS Coronavirus 2 Ag: NEGATIVE

## 2021-12-29 LAB — POCT RAPID STREP A (OFFICE): Rapid Strep A Screen: POSITIVE — AB

## 2021-12-29 LAB — POCT INFLUENZA A/B
Influenza A, POC: NEGATIVE
Influenza B, POC: NEGATIVE

## 2021-12-29 MED ORDER — AMOXICILLIN 500 MG PO CAPS: 500.0000 mg | ORAL_CAPSULE | Freq: Two times a day (BID) | ORAL | 0 refills | Status: AC

## 2021-12-29 NOTE — Progress Notes (Signed)
Subjective:    Patient ID: Cheyenne Flowers, female    DOB: 1960-03-10, 61 y.o.   MRN: [id number]  HPI  Cheyenne Flowers is a very pleasant 61 y.o. female with a history of hypertension, GERD, chronic diarrhea, type 2 diabetes, hyperlipidemia, cholecystectomy, chronic cough who presents today for ED follow-up and to discuss sore throat.  She presented to [facility] ED on 12/19/2021 for acute nausea, vomiting, diarrhea that began at 4 AM the day prior.  She recalls eating some takeout food prior to symptom onset, woke during the middle the night feeling nauseous with multiple episodes of nonbloody and nonbilious vomit.  At that point her watery diarrhea had resolved but vomiting persisted.  She denies abdominal pain.  She does note that she felt a diffuse generalized headache without visual changes which is what prompted her to seek treatment.  During her ED stay she underwent IV fluids.  Labs drawn including blood cultures, CBC, urine pregnancy, lipase, lactic acid, urinalysis.  Urinalysis revealed some RBCs with small amount of leuks.  She was treated with IV Rocephin 1 g.  Also treated with Toradol 15 mg, Compazine 10 mg, diphenhydramine 12.5 mg, Zofran 4 mg for migraine.  She was discharged home later that day as symptoms had improved and she was tolerating p.o. intake. She was also prescribed cefuroxime 250 mg BID, has taken only four doses.   Since her ED visit she's feeling better. She has not had nausea or vomiting has resolved. Her appetite has improved.   Her sore throat began 4 days ago, worse over the last 24 hours. She denies fevers, cough, body aches, headaches. Her migraine abated 5 days ago.   Review of Systems  Constitutional:  Negative for chills and fever.  HENT:  Positive for sore throat.   Gastrointestinal:  Negative for abdominal pain, diarrhea, nausea and vomiting.         Past Medical History:  Diagnosis Date   Acute foreign body of right ear canal 01/20/2019   Allergy     Anxiety and depression    Arthritis    Chronic neck pain    Complication of anesthesia    Diverticulosis    GERD (gastroesophageal reflux disease)    Hyperlipidemia    Hypertension    Laceration of right ear canal 01/20/2019   PONV (postoperative nausea and vomiting)    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HCC)    Type 2 diabetes mellitus (HCC)     Social History   Socioeconomic History   Marital status: Married    Spouse name: Not on file   Number of children: Not on file   Years of education: Not on file   Highest education level: Not on file  Occupational History   Not on file  Tobacco Use   Smoking status: Never   Smokeless tobacco: Never  Vaping Use   Vaping Use: Never used  Substance and Sexual Activity   Alcohol use: Never   Drug use: Never   Sexual activity: Not on file  Other Topics Concern   Not on file  Social History Narrative   Married.   5 children, 9 grandchildren.   Works in administration.    Enjoys watching her grandchildren, spending time with family.   Social Determinants of Health   Financial Resource Strain: Not on file  Food Insecurity: Not on file  Transportation Needs: Not on file  Physical Activity: Not on file  Stress: Not on file  Social Connections: Not on file  Intimate Partner  Violence: Not on file    Past Surgical History:  Procedure Laterality Date   CERVICAL FUSION  2007   CHOLECYSTECTOMY     DG GALL BLADDER  1990   EXTERNAL EAR SURGERY  2008, 2017   ORIF HUMERUS FRACTURE Left 11/12/2019   Procedure: open reduction internal fixation of left greater tuberosity fracture;  Surgeon: Francena Hanly, MD;  Location: [facility];  Service: Orthopedics;  Laterality: Left;     Family History  Problem Relation Age of Onset   COPD Mother    Diabetes Mother    Heart disease Mother    Diabetes Brother    Hypertension Brother     Allergies  Allergen Reactions   Latex Hives and Rash   Clindamycin Rash   Codeine Nausea Only    Other  reaction(s): Other (See Comments) Sick feeling    Quinolones Rash    Current Outpatient Medications on File Prior to Visit  Medication Sig Dispense Refill   atorvastatin (LIPITOR) 10 MG tablet TAKE 1 TABLET BY MOUTH EVERY DAY FOR CHOLESTEROL 90 tablet 0   gabapentin (NEURONTIN) 600 MG tablet TAKE 1 TABLET (600 MG TOTAL) BY MOUTH AT BEDTIME. FOR NECK PAIN. 90 tablet 2   losartan (COZAAR) 25 MG tablet TAKE 1 TABLET BY MOUTH EVERY DAY FOR BLOOD PRESSURE 90 tablet 0   montelukast (SINGULAIR) 10 MG tablet TAKE 1 TABLET (10 MG TOTAL) BY MOUTH AT BEDTIME. FOR ALLERGIES. 90 tablet 2   nortriptyline (PAMELOR) 10 MG capsule TAKE 1 CAPSULE (10 MG TOTAL) BY MOUTH AT BEDTIME. FOR DIARRHEA. 90 capsule 2   OZEMPIC, 1 MG/DOSE, 4 MG/3ML SOPN Inject 1 mg under the skin as directed once a week (every 7 days) for diabetes. 9 mL 0   cetirizine (ZYRTEC) 10 MG tablet TAKE 1 TABLET BY MOUTH EVERY DAY as needed for allergies. (Patient not taking: Reported on 12/29/2021) 90 tablet 3   No current facility-administered medications on file prior to visit.    BP 124/82   Pulse 100   Temp 98.4 F (36.9 C) (Temporal)   Ht 5\' 4"  (1.626 m)   Wt 168 lb (76.2 kg)   SpO2 98%   BMI 28.84 kg/m  Objective:   Physical Exam HENT:     Right Ear: Tympanic membrane and ear canal normal.     Left Ear: Tympanic membrane and ear canal normal.     Nose:     Right Sinus: No maxillary sinus tenderness or frontal sinus tenderness.     Left Sinus: No maxillary sinus tenderness or frontal sinus tenderness.     Mouth/Throat:     Pharynx: Pharyngeal swelling and posterior oropharyngeal erythema present.  Eyes:     Conjunctiva/sclera: Conjunctivae normal.  Cardiovascular:     Rate and Rhythm: Normal rate and regular rhythm.  Pulmonary:     Effort: Pulmonary effort is normal.     Breath sounds: Normal breath sounds. No wheezing or rales.  Musculoskeletal:     Cervical back: Neck supple.  Lymphadenopathy:     Cervical: No  cervical adenopathy.  Skin:    General: Skin is warm and dry.           Assessment & Plan:   Problem List Items Addressed This Visit       Respiratory   Strep pharyngitis    Positive strep A test today in the office. Exam supportive.  Stop cefuuroxime as this is not the preferred regimen. Start Amoxil 500 mg BID x 10  days.  Follow up PRN.      Relevant Medications   amoxicillin (AMOXIL) 500 MG capsule     Digestive   Nausea vomiting and diarrhea    Recent ED visit.  ED notes and labs reviewed.   Symptoms resolved. She appears stable today.        Other Visit Diagnoses     Sore throat    -  Primary   Relevant Orders   POC COVID-19 (Completed)   Influenza A/B (Completed)   POCT rapid strep A (Completed)          Doreene Nest, NP

## 2021-12-29 NOTE — Patient Instructions (Addendum)
Start amoxicillin antibiotics for the infection. Take 1 tablet by mouth twice daily for 10 days.  Schedule a follow up visit for diabetes and weight.  It was a pleasure to see you today!

## 2021-12-29 NOTE — Assessment & Plan Note (Signed)
Recent ED visit.  ED notes and labs reviewed.   Symptoms resolved. She appears stable today.

## 2021-12-29 NOTE — Assessment & Plan Note (Signed)
Positive strep A test today in the office. Exam supportive.  Stop cefuuroxime as this is not the preferred regimen. Start Amoxil 500 mg BID x 10 days.  Follow up PRN.

## 2022-01-05 ENCOUNTER — Other Ambulatory Visit: Payer: Self-pay | Admitting: Primary Care

## 2022-01-05 DIAGNOSIS — I10 Essential (primary) hypertension: Secondary | ICD-10-CM

## 2022-01-05 DIAGNOSIS — E785 Hyperlipidemia, unspecified: Secondary | ICD-10-CM

## 2022-01-05 NOTE — Telephone Encounter (Signed)
Patient is due for diabetes follow up.   I see that she's scheduled for next December, but needs to be seen within the next 1 month for repeat A1C. Please schedule at her convenience.

## 2022-01-11 ENCOUNTER — Ambulatory Visit: Payer: BC Managed Care – PPO | Admitting: Primary Care

## 2022-01-23 ENCOUNTER — Ambulatory Visit: Payer: BC Managed Care – PPO | Admitting: Primary Care

## 2022-01-24 ENCOUNTER — Telehealth (INDEPENDENT_AMBULATORY_CARE_PROVIDER_SITE_OTHER): Payer: BC Managed Care – PPO | Admitting: Primary Care

## 2022-01-24 ENCOUNTER — Encounter: Payer: Self-pay | Admitting: Primary Care

## 2022-01-24 VITALS — Ht 64.0 in | Wt 168.0 lb

## 2022-01-24 DIAGNOSIS — U071 COVID-19: Secondary | ICD-10-CM | POA: Diagnosis not present

## 2022-01-24 HISTORY — DX: COVID-19: U07.1

## 2022-01-24 MED ORDER — NIRMATRELVIR/RITONAVIR (PAXLOVID)TABLET: 3.0000 | ORAL_TABLET | Freq: Two times a day (BID) | ORAL | 0 refills | Status: AC

## 2022-01-24 MED ORDER — BENZONATATE 200 MG PO CAPS: 200.0000 mg | ORAL_CAPSULE | Freq: Three times a day (TID) | ORAL | 0 refills | Status: DC | PRN

## 2022-01-24 NOTE — Assessment & Plan Note (Signed)
Qualifies for antiviral treatment, discussed this with patient today. GFR reviewed from December 2023.  Start Paxlovid, Rx sent to pharmacy. Start [employer]. Take 1 capsule by mouth three times daily as needed for cough.  Discussed quarantine precautions. Work note provided.  Follow up PRN.

## 2022-01-24 NOTE — Progress Notes (Signed)
Patient ID: Cheyenne Flowers, female    DOB: July 21, 1960, 62 y.o.   MRN: [id number]  Virtual visit completed through [employer], a video enabled telemedicine application. Due to national recommendations of social distancing due to COVID-19, a virtual visit is felt to be most appropriate for this patient at this time. Reviewed limitations, risks, security and privacy concerns of performing a virtual visit and the availability of in person appointments. I also reviewed that there may be a patient responsible charge related to this service. The patient agreed to proceed.   Patient location: home Provider location: [place] at [facility], office Persons participating in this virtual visit: patient, provider   If any vitals were documented, they were collected by patient at home unless specified below.    Ht 5\' 4"  (1.626 m)   Wt 168 lb (76.2 kg)   BMI 28.84 kg/m    CC: Covid-19 infection Subjective:   HPI: Cheyenne Flowers is a 62 y.o. female with a history of type 2 diabetes, hyperlipidemia, vitamin D deficienc  presenting on 01/24/2022 for Covid Positive (Tested positive Monday/Productive cough, runny nose, sore throat, headache, chills/No fever)  Symptom onset three evenings ago with headaches and rhinorrhea. She then developed cough, chest congestion, sleep disturbance.   She tested positive for Covid-19 infection two days ago.   She's taken Mucinex. She's never had Covid-19 infection before. Her most bothersome symptom is her cough, she hardly slept last night.  She tried to work from home on Monday but couldn't due to her symptoms. She has been off work since.       Relevant past medical, surgical, family and social history reviewed and updated as indicated. Interim medical history since our last visit reviewed. Allergies and medications reviewed and updated. Outpatient Medications Prior to Visit  Medication Sig Dispense Refill   atorvastatin (LIPITOR) 10 MG tablet TAKE 1 TABLET  BY MOUTH EVERY DAY FOR CHOLESTEROL 90 tablet 0   gabapentin (NEURONTIN) 600 MG tablet TAKE 1 TABLET (600 MG TOTAL) BY MOUTH AT BEDTIME. FOR NECK PAIN. 90 tablet 2   losartan (COZAAR) 25 MG tablet TAKE 1 TABLET BY MOUTH EVERY DAY FOR BLOOD PRESSURE 90 tablet 0   montelukast (SINGULAIR) 10 MG tablet TAKE 1 TABLET (10 MG TOTAL) BY MOUTH AT BEDTIME. FOR ALLERGIES. 90 tablet 2   nortriptyline (PAMELOR) 10 MG capsule TAKE 1 CAPSULE (10 MG TOTAL) BY MOUTH AT BEDTIME. FOR DIARRHEA. 90 capsule 2   cetirizine (ZYRTEC) 10 MG tablet TAKE 1 TABLET BY MOUTH EVERY DAY as needed for allergies. (Patient not taking: Reported on 12/29/2021) 90 tablet 3   OZEMPIC, 1 MG/DOSE, 4 MG/3ML SOPN Inject 1 mg under the skin as directed once a week (every 7 days) for diabetes. (Patient not taking: Reported on 01/24/2022) 9 mL 0   No facility-administered medications prior to visit.     Per HPI unless specifically indicated in ROS section below Review of Systems  Constitutional:  Positive for chills and fatigue.  HENT:  Positive for congestion.   Respiratory:  Positive for cough.   Neurological:  Positive for headaches.   Objective:  Ht 5\' 4"  (1.626 m)   Wt 168 lb (76.2 kg)   BMI 28.84 kg/m   Wt Readings from Last 3 Encounters:  01/24/22 168 lb (76.2 kg)  12/29/21 168 lb (76.2 kg)  07/07/21 165 lb (74.8 kg)       Physical exam: General: Alert and oriented x 3, no distress, does appear sickly  Pulmonary: Speaks  in complete sentences without increased work of breathing, no cough during visit.  Psychiatric: Normal mood, thought content, and behavior.     Results for orders placed or performed in visit on 12/29/21  POC COVID-19  Result Value Ref Range   SARS Coronavirus 2 Ag Negative Negative  Influenza A/B  Result Value Ref Range   Influenza A, POC Negative Negative   Influenza B, POC Negative Negative  POCT rapid strep A  Result Value Ref Range   Rapid Strep A Screen Positive (A) Negative    Assessment & Plan:   Problem List Items Addressed This Visit       Other   COVID-19 virus infection - Primary    Qualifies for antiviral treatment, discussed this with patient today. GFR reviewed from December 2023.  Start Paxlovid, Rx sent to pharmacy. Start [employer] Take 1 capsule by mouth three times daily as needed for cough.  Discussed quarantine precautions. Work note provided.  Follow up PRN.      Relevant Medications   nirmatrelvir/ritonavir (PAXLOVID) 20 x 150 MG & 10 x 100MG  TABS   benzonatate (TESSALON) 200 MG capsule     Meds ordered this encounter  Medications   nirmatrelvir/ritonavir (PAXLOVID) 20 x 150 MG & 10 x 100MG  TABS    Sig: Take 3 tablets by mouth 2 (two) times daily for 5 days.    Dispense:  30 tablet    Refill:  0    Order Specific Question:   Supervising Provider    Answer:   BEDSOLE, AMY E [[id number]]   benzonatate (TESSALON) 200 MG capsule    Sig: Take 1 capsule (200 mg total) by mouth 3 (three) times daily as needed for cough.    Dispense:  15 capsule    Refill:  0    Order Specific Question:   Supervising Provider    Answer:   BEDSOLE, AMY E [[id number]]   No orders of the defined types were placed in this encounter.   I discussed the assessment and treatment plan with the patient. The patient was provided an opportunity to ask questions and all were answered. The patient agreed with the plan and demonstrated an understanding of the instructions. The patient was advised to call back or seek an in-person evaluation if the symptoms worsen or if the condition fails to improve as anticipated.  Follow up plan:  Start Paxlovid antiviral medication for Covid infection. Follow the package instructions  You may take Benzonatate capsules for cough. Take 1 capsule by mouth three times daily as needed for cough.  It was a pleasure to see you today!   Pleas Koch, NP

## 2022-01-24 NOTE — Patient Instructions (Signed)
Start Paxlovid antiviral medication for Covid infection. Follow the package instructions  You may take Benzonatate capsules for cough. Take 1 capsule by mouth three times daily as needed for cough.  It was a pleasure to see you today!

## 2022-01-30 ENCOUNTER — Ambulatory Visit: Payer: BC Managed Care – PPO | Admitting: Primary Care

## 2022-01-30 ENCOUNTER — Encounter: Payer: Self-pay | Admitting: Primary Care

## 2022-01-30 VITALS — BP 128/78 | HR 97 | Temp 98.8°F | Ht 64.0 in | Wt 172.0 lb

## 2022-01-30 DIAGNOSIS — H9201 Otalgia, right ear: Secondary | ICD-10-CM | POA: Diagnosis not present

## 2022-01-30 DIAGNOSIS — H6121 Impacted cerumen, right ear: Secondary | ICD-10-CM | POA: Insufficient documentation

## 2022-01-30 HISTORY — DX: Impacted cerumen, right ear: H61.21

## 2022-01-30 LAB — POCT RAPID STREP A (OFFICE): Rapid Strep A Screen: NEGATIVE

## 2022-01-30 NOTE — Patient Instructions (Signed)
Strep test was negative today.   Try the OTC Debrox drops for your ear and see if you are able to remove the wax.   Referral has been placed for the ENT and you should get a mychart message or a phone call with instructions. If you do not hear back within two weeks, please let us know.   Please update if symptoms worsen or do not improve.   It was a pleasure to see you today!

## 2022-01-30 NOTE — Progress Notes (Signed)
Subjective:    Patient ID: Cheyenne Flowers, female    DOB: [birth year], 62 y.o.   MRN: [id number]  Otalgia  Associated symptoms include coughing and a sore throat.    Cheyenne Flowers is a very pleasant 62 y.o. female with a history of strep pharyngitis, type 2 diabetes, fatigue who presents today   She was last evaluated on 01/24/22 virtually for a three day history of headaches, rhinorrhea, cough, chest congestion. She tested positive for Covid-19 infection and was treated with Paxlovid and Tessalon Perles.   Today she mentions a five day history of right ear pain. Her pain is intermittent with chewing and swallowing. She describes her pain as sharp/shooting.   She has a lingering cough and sore throat since Covid-19 diagnosis. She tried Mucinex last week which caused her to feel worse. She has a history of surgery to right ear so she became concerned given her pain.   Treated with strep pharyngitis in December 2023. She denies fevers, chills, diarrhea, body aches, headaches.    Review of Systems  Constitutional:  Negative for fever.  HENT:  Positive for ear pain and sore throat.   Respiratory:  Positive for cough.          Past Medical History:  Diagnosis Date   Acute foreign body of right ear canal 01/20/2019   Allergy    Anxiety and depression    Arthritis    Chronic neck pain    Complication of anesthesia    Diverticulosis    GERD (gastroesophageal reflux disease)    Hyperlipidemia    Hypertension    Laceration of right ear canal 01/20/2019   PONV (postoperative nausea and vomiting)    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[place])    Type 2 diabetes mellitus ([place])     Social History   Socioeconomic History   Marital status: Married    Spouse name: Not on file   Number of children: Not on file   Years of education: Not on file   Highest education level: Not on file  Occupational History   Not on file  Tobacco Use   Smoking status: Never   Smokeless tobacco: Never  Vaping Use    Vaping Use: Never used  Substance and Sexual Activity   Alcohol use: Never   Drug use: Never   Sexual activity: Not on file  Other Topics Concern   Not on file  Social History Narrative   Married.   5 children, 9 grandchildren.   Works in administration.    Enjoys watching her grandchildren, spending time with family.   Social Determinants of Health   Financial Resource Strain: Not on file  Food Insecurity: Not on file  Transportation Needs: Not on file  Physical Activity: Not on file  Stress: Not on file  Social Connections: Not on file  Intimate Partner Violence: Not on file    Past Surgical History:  Procedure Laterality Date   CERVICAL FUSION  2007   [place]  2008, 2017   ORIF HUMERUS FRACTURE Left 11/12/2019   Procedure: open reduction internal fixation of left greater tuberosity fracture;  Surgeon: Justice Britain, MD;  Location: [facility];  Service: Orthopedics;  Laterality: Left;  120min    Family History  Problem Relation Age of Onset   COPD Mother    Diabetes Mother    Heart disease Mother    Diabetes Brother    Hypertension Brother  Allergies  Allergen Reactions   Latex Hives and Rash   Clindamycin Rash   Codeine Nausea Only    Other reaction(s): Other (See Comments) Sick feeling    Quinolones Rash    Current Outpatient Medications on File Prior to Visit  Medication Sig Dispense Refill   atorvastatin (LIPITOR) 10 MG tablet TAKE 1 TABLET BY MOUTH EVERY DAY FOR CHOLESTEROL 90 tablet 0   benzonatate (TESSALON) 200 MG capsule Take 1 capsule (200 mg total) by mouth 3 (three) times daily as needed for cough. 15 capsule 0   cetirizine (ZYRTEC) 10 MG tablet TAKE 1 TABLET BY MOUTH EVERY DAY as needed for allergies. 90 tablet 3   gabapentin (NEURONTIN) 600 MG tablet TAKE 1 TABLET (600 MG TOTAL) BY MOUTH AT BEDTIME. FOR NECK PAIN. 90 tablet 2   losartan (COZAAR) 25 MG tablet TAKE 1 TABLET BY MOUTH  EVERY DAY FOR BLOOD PRESSURE 90 tablet 0   montelukast (SINGULAIR) 10 MG tablet TAKE 1 TABLET (10 MG TOTAL) BY MOUTH AT BEDTIME. FOR ALLERGIES. 90 tablet 2   nortriptyline (PAMELOR) 10 MG capsule TAKE 1 CAPSULE (10 MG TOTAL) BY MOUTH AT BEDTIME. FOR DIARRHEA. 90 capsule 2   OZEMPIC, 1 MG/DOSE, 4 MG/3ML SOPN Inject 1 mg under the skin as directed once a week (every 7 days) for diabetes. (Patient not taking: Reported on 01/30/2022) 9 mL 0   No current facility-administered medications on file prior to visit.    BP 128/78   Pulse 97   Temp 98.8 F (37.1 C) (Temporal)   Ht 5\' 4"  (1.626 m)   Wt 172 lb (78 kg)   SpO2 99%   BMI 29.52 kg/m  Objective:   Physical Exam HENT:     Right Ear: There is impacted cerumen. Tympanic membrane is not erythematous.     Left Ear: Tympanic membrane and ear canal normal.     Nose:     Right Sinus: No maxillary sinus tenderness or frontal sinus tenderness.     Left Sinus: No maxillary sinus tenderness or frontal sinus tenderness.     Mouth/Throat:     Mouth: Mucous membranes are moist.     Comments: Unable to visualize pharynx due to tongue with gag reflex. Eyes:     Conjunctiva/sclera: Conjunctivae normal.  Cardiovascular:     Rate and Rhythm: Normal rate and regular rhythm.  Pulmonary:     Effort: Pulmonary effort is normal.     Breath sounds: Normal breath sounds. No wheezing or rales.  Musculoskeletal:     Cervical back: Neck supple.  Lymphadenopathy:     Cervical: No cervical adenopathy.  Skin:    General: Skin is warm and dry.           Assessment & Plan:  Ear pain, right Assessment & Plan: Suspect symptoms today secondary to cerumen impaction.  POC Rapid strep A was negative today.  Some cerumen was removed to visualize the tympanic membrane and ruled out acute otitis media.   Recommended patient to use Debrox drops at home.   Referral placed for ENT for impacted cerumen in the right ear.   I evaluated patient, was consulted  regarding treatment, and agree with assessment and plan per , RN, DNP student.   Modesto Charon, NP-C'  Orders: -     Ambulatory referral to ENT -     POCT rapid strep A  Impacted cerumen of right ear Assessment & Plan: Rightc erumen impaction identified on exam. Patient consented to  irrigation of right canal.  Right canals irrigated. Patient could not tolerate due to dizziness.   Some wax was removed which revealed a non infectious TM.  Start Debrox drops. Referral placed to ENT.   Orders: -     Ambulatory referral to ENT        Pleas Koch, NP

## 2022-01-30 NOTE — Assessment & Plan Note (Signed)
Rightc erumen impaction identified on exam. Patient consented to irrigation of right canal.  Right canals irrigated. Patient could not tolerate due to dizziness.   Some wax was removed which revealed a non infectious TM.  Start Debrox drops. Referral placed to ENT.

## 2022-01-30 NOTE — Progress Notes (Signed)
Established Patient Office Visit  Subjective   Patient ID: Cheyenne Flowers, female    DOB: January 14, 1960  Age: 62 y.o. MRN: [id number]  Chief Complaint  Patient presents with   Otalgia    Ear pain and sore throat. Ear pain started at the end of last week. It is painful to swallow and chew. No drainage from ears.     Otalgia  Associated symptoms include a sore throat. Pertinent negatives include no coughing, diarrhea, ear discharge or headaches.    Cheyenne Flowers is a 62 year old female with past medication history of HTN, strep, type 2 diabetes, HLD, anxiety and depression presents today for an acute visit.   She had a virtual visit on 01/24/22 for COVID and was treated with Paxlovid and Tessalon Perles. She sent a mychart message on 01/26/22 that she had developed diarrhea and for that she took imodium. She sent another my chart message yesterday when she developed an ear ache in her right ear.   Today, she reports that her right earache started Thursday of last week. Swallowing and chewing causes ear pain. She denies inserting anything in the ear. She denies any discharge/drainage from ears. She denies any hearing loss. She has tried Advil with no relief. She still has a cough and feels much better than last week when she had covid. She tested positive for Strep A on 12/29/21 and was treated with Amoxicillin 500 mg for ten days. She has completed the course and got completely better before she tested positive for covid. She tried Mucinex last week but it made her feel worse.   Reports she has had two surgeries for her right ear drum and is concerned. She has seen an ENT in the past to have cerumen removed.  Patient Active Problem List   Diagnosis Date Noted   Ear pain, right 01/30/2022   COVID-19 virus infection 01/24/2022   Strep pharyngitis 12/29/2021   Nausea vomiting and diarrhea 12/29/2021   Localized skin mass, lump, or swelling 06/14/2021   Class 1 obesity due to excess calories  with body mass index (BMI) of 30.0 to 30.9 in adult 12/22/2020   Chronic cough 09/04/2019   Abscess of groin, right 03/17/2019   Chronic hip pain 02/13/2019   Vitamin D deficiency 10/17/2018   Preventative health care 07/16/2018   Fatigue 07/16/2018   Chronic diarrhea 05/16/2017   Generalized abdominal pain 05/16/2017   Type 2 diabetes mellitus with hyperglycemia ([employer]) 01/24/2017   Decreased renal function 01/24/2017   Essential hypertension 01/24/2017   Anxiety and depression 01/24/2017   Chronic neck pain 01/24/2017   Hyperlipidemia 01/24/2017   GERD (gastroesophageal reflux disease) 01/24/2017   Past Medical History:  Diagnosis Date   Acute foreign body of right ear canal 01/20/2019   Allergy    Anxiety and depression    Arthritis    Chronic neck pain    Complication of anesthesia    Diverticulosis    GERD (gastroesophageal reflux disease)    Hyperlipidemia    Hypertension    Laceration of right ear canal 01/20/2019   PONV (postoperative nausea and vomiting)    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[place])    Type 2 diabetes mellitus ([place])    Past Surgical History:  Procedure Laterality Date   CERVICAL FUSION  2007   CHOLECYSTECTOMY     DG GALL BLADDER  1990   EXTERNAL EAR SURGERY  2008, 2017   ORIF HUMERUS FRACTURE Left 11/12/2019   Procedure: open reduction internal fixation of  left greater tuberosity fracture;  Surgeon: Justice Britain, MD;  Location: [facility];  Service: Orthopedics;  Laterality: Left;  180min   Social History   Tobacco Use   Smoking status: Never   Smokeless tobacco: Never  Vaping Use   Vaping Use: Never used  Substance Use Topics   Alcohol use: Never   Drug use: Never   Family History  Problem Relation Age of Onset   COPD Mother    Diabetes Mother    Heart disease Mother    Diabetes Brother    Hypertension Brother    Allergies  Allergen Reactions   Latex Hives and Rash   Clindamycin Rash   Codeine Nausea Only    Other reaction(s): Other (See  Comments) Sick feeling    Quinolones Rash      Review of Systems  Constitutional:  Negative for chills and fever.  HENT:  Positive for ear pain and sore throat. Negative for ear discharge and sinus pain.   Respiratory:  Positive for sputum production. Negative for cough and shortness of breath.   Cardiovascular:  Negative for chest pain.  Gastrointestinal:  Negative for diarrhea.  Neurological:  Negative for dizziness and headaches.      Objective:     BP 128/78   Pulse 97   Temp 98.8 F (37.1 C) (Temporal)   Ht 5\' 4"  (1.626 m)   Wt 172 lb (78 kg)   SpO2 99%   BMI 29.52 kg/m  BP Readings from Last 3 Encounters:  01/30/22 128/78  12/29/21 124/82  07/07/21 124/64   Wt Readings from Last 3 Encounters:  01/30/22 172 lb (78 kg)  01/24/22 168 lb (76.2 kg)  12/29/21 168 lb (76.2 kg)      Physical Exam Vitals and nursing note reviewed.  HENT:     Right Ear: Ear canal normal. There is impacted cerumen.     Left Ear: Tympanic membrane, ear canal and external ear normal.     Ears:     Comments: Unable to visualize TM in right ear due to cerumen.    Mouth/Throat:     Mouth: Mucous membranes are moist.     Comments: Unable to visualize pharynx due to gag reflex. Cardiovascular:     Rate and Rhythm: Normal rate and regular rhythm.     Pulses: Normal pulses.     Heart sounds: Normal heart sounds.  Pulmonary:     Effort: Pulmonary effort is normal.     Breath sounds: Normal breath sounds.  Neurological:     Mental Status: She is alert and oriented to person, place, and time.  Psychiatric:        Mood and Affect: Mood normal.        Behavior: Behavior normal.      Results for orders placed or performed in visit on 01/30/22  POCT rapid strep A  Result Value Ref Range   Rapid Strep A Screen Negative Negative       The 10-year ASCVD risk score (Arnett DK, et al., 2019) is: 6.8%    Assessment & Plan:   Problem List Items Addressed This Visit       Other    Ear pain, right - Primary    POC Rapid strep A was negative today.  Attempted to flush cerumen from right ear; patient became dizzy and flushed.   Some cerumen was removed to visualize the tympanic membrane and ruled out acute otitis media.   Recommended patient to use Debrox drops  at home.   Referral placed for ENT for impacted cerumen in the right ear.       Relevant Orders   Ambulatory referral to ENT   POCT rapid strep A (Completed)   Other Visit Diagnoses     Impacted cerumen of right ear       Relevant Orders   Ambulatory referral to ENT       No follow-ups on file.    [place] Charon, BSN-RN, DNP STUDENT

## 2022-01-30 NOTE — Assessment & Plan Note (Addendum)
Suspect symptoms today secondary to cerumen impaction.  POC Rapid strep A was negative today.  Some cerumen was removed to visualize the tympanic membrane and ruled out acute otitis media.   Recommended patient to use Debrox drops at home.   Referral placed for ENT for impacted cerumen in the right ear.   I evaluated patient, was consulted regarding treatment, and agree with assessment and plan per Tinnie Gens, RN, DNP student.   Allie Bossier, NP-C'

## 2022-02-01 ENCOUNTER — Encounter: Payer: Self-pay | Admitting: *Deleted

## 2022-02-09 ENCOUNTER — Encounter: Payer: Self-pay | Admitting: Primary Care

## 2022-02-09 ENCOUNTER — Ambulatory Visit: Payer: BC Managed Care – PPO | Admitting: Primary Care

## 2022-02-09 VITALS — BP 126/84 | HR 105 | Temp 97.9°F | Ht 64.0 in | Wt 171.0 lb

## 2022-02-09 DIAGNOSIS — E1165 Type 2 diabetes mellitus with hyperglycemia: Secondary | ICD-10-CM | POA: Diagnosis not present

## 2022-02-09 LAB — POCT GLYCOSYLATED HEMOGLOBIN (HGB A1C): Hemoglobin A1C: 5.8 % — AB (ref 4.0–5.6)

## 2022-02-09 MED ORDER — OZEMPIC (0.25 OR 0.5 MG/DOSE) 2 MG/3ML ~~LOC~~ SOPN: PEN_INJECTOR | SUBCUTANEOUS | 0 refills | Status: DC

## 2022-02-09 NOTE — Addendum Note (Signed)
Addended by: Ellamae Sia on: 02/09/2022 10:04 AM   Modules accepted: Orders

## 2022-02-09 NOTE — Patient Instructions (Signed)
Start semaglutide (Ozempic) for diabetes/weight loss. Start by injecting 0.25 mg into the skin once weekly for 2 weeks, then increase to 0.5 mg once weekly x 2 weeks. Please notify me once you've used your last 0.25/0.5 mg mg pen so that I can prescribe the next dose.   Please schedule a physical to meet with me in 6 months.   It was a pleasure to see you today!

## 2022-02-09 NOTE — Assessment & Plan Note (Signed)
Slightly worse, but still overall controlled with A1C today of 5.8.  Resume Ozempic at 0.25 mg weekly x 2 weeks, then increase to 0.5 mg weekly x 2 weeks, then increase back to 1 mg weekly. Consider dose adjustment further for weight loss.  Foot exam today. Urine microalbumin due and pending.  Follow up in 6 months.

## 2022-02-09 NOTE — Progress Notes (Signed)
Subjective:    Patient ID: Cheyenne Flowers, female    DOB: [birth year], 62 y.o.   MRN: [id number]  HPI  Cheyenne Flowers is a very pleasant 62 y.o. female with a history of hypertension, type 2 diabetes, hyperlipidemia, obesity who presents today for follow up of diabetes.  Current medications include: Ozempic 1 mg weekly. She stopped her Ozempic in mid December 2023 when she became sick.   She is checking her blood glucose 0 times daily.   Last A1C: 5.6 in June 2023, 5.8 today Last Eye Exam: UTD Last Foot Exam: Due Pneumonia Vaccination: 2019 Urine Microalbumin: Due Statin: atorvastatin   Dietary changes since last visit: Cutting back on sweets.    Exercise: None.    Wt Readings from Last 3 Encounters:  02/09/22 171 lb (77.6 kg)  01/30/22 172 lb (78 kg)  01/24/22 168 lb (76.2 kg)      Review of Systems  Respiratory:  Negative for shortness of breath.   Cardiovascular:  Negative for chest pain.  Endocrine: Negative for polydipsia, polyphagia and polyuria.  Neurological:  Negative for numbness.         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   5 children, 9 grandchildren.   Works in administration.    Enjoys watching her grandchildren, spending time with family.   Social Determinants of Health   Financial Resource Strain: Not on file  Food Insecurity: Not on file  Transportation Needs: Not on file  Physical Activity: Not on file  Stress: Not on file  Social Connections: Not on file  Intimate Partner Violence: Not on file    Past Surgical History:  Procedure Laterality Date   CERVICAL FUSION  2007   [place]  2008, 2017   ORIF HUMERUS FRACTURE Left 11/12/2019   Procedure: open reduction internal fixation of left greater tuberosity fracture;  Surgeon: Justice Britain, MD;  Location: [facility];  Service: Orthopedics;  Laterality: Left;  128min    Family History  Problem Relation Age of Onset   COPD Mother    Diabetes Mother    Heart disease Mother    Diabetes Brother    Hypertension Brother     Allergies  Allergen Reactions   Latex Hives and Rash   Clindamycin  Rash   Codeine Nausea Only    Other reaction(s): Other (See Comments) Sick feeling    Quinolones Rash    Current Outpatient Medications on File Prior to Visit  Medication Sig Dispense Refill   atorvastatin (LIPITOR) 10 MG tablet TAKE 1 TABLET BY MOUTH EVERY DAY FOR CHOLESTEROL 90 tablet 0   cetirizine (ZYRTEC) 10 MG tablet TAKE 1 TABLET BY MOUTH EVERY DAY as needed for allergies. 90 tablet 3   gabapentin (NEURONTIN) 600 MG tablet TAKE 1 TABLET (600 MG TOTAL) BY MOUTH AT BEDTIME. FOR NECK PAIN. 90 tablet 2   losartan (COZAAR) 25 MG tablet TAKE 1 TABLET BY MOUTH EVERY DAY FOR BLOOD PRESSURE 90 tablet 0   montelukast (SINGULAIR) 10 MG tablet TAKE 1 TABLET (10 MG TOTAL) BY MOUTH AT BEDTIME. FOR ALLERGIES. 90 tablet 2   nortriptyline (PAMELOR) 10 MG capsule TAKE 1 CAPSULE (10 MG TOTAL) BY MOUTH AT BEDTIME.  FOR DIARRHEA. 90 capsule 2   OZEMPIC, 1 MG/DOSE, 4 MG/3ML SOPN Inject 1 mg under the skin as directed once a week (every 7 days) for diabetes. 9 mL 0   benzonatate (TESSALON) 200 MG capsule Take 1 capsule (200 mg total) by mouth 3 (three) times daily as needed for cough. (Patient not taking: Reported on 02/09/2022) 15 capsule 0   No current facility-administered medications on file prior to visit.    BP 126/84   Pulse (!) 105   Temp 97.9 F (36.6 C) (Temporal)   Ht 5\' 4"  (1.626 m)   Wt 171 lb (77.6 kg)   SpO2 97%   BMI 29.35 kg/m  Objective:   Physical Exam Cardiovascular:     Rate and Rhythm: Normal rate and regular rhythm.  Pulmonary:     Effort: Pulmonary effort is normal.     Breath sounds: Normal breath sounds.  Musculoskeletal:     Cervical back: Neck supple.  Skin:    General: Skin is warm and dry.           Assessment & Plan:  Type 2 diabetes mellitus with hyperglycemia, without long-term current use of insulin (HCC) Assessment & Plan: Slightly worse, but still overall controlled with A1C today of 5.8.  Resume Ozempic at 0.25 mg weekly x 2 weeks, then increase to 0.5 mg weekly x 2 weeks, then increase back to 1 mg weekly. Consider dose adjustment further for weight loss.  Foot exam today. Urine microalbumin due and pending.  Follow up in 6 months.  Orders: -     POCT glycosylated hemoglobin (Hb A1C) -     Microalbumin / creatinine urine ratio -     Ozempic (0.25 or 0.5 MG/DOSE); Inject 0.25 mg into the skin once weekly for 2 weeks, then increase to 0.5 mg once weekly for 2 weeks.  Dispense: 3 mL; Refill: 0        Pleas Koch, NP

## 2022-03-08 ENCOUNTER — Other Ambulatory Visit: Payer: Self-pay | Admitting: Primary Care

## 2022-03-08 DIAGNOSIS — E1165 Type 2 diabetes mellitus with hyperglycemia: Secondary | ICD-10-CM

## 2022-03-08 MED ORDER — SEMAGLUTIDE (1 MG/DOSE) 4 MG/3ML ~~LOC~~ SOPN: 1.0000 mg | PEN_INJECTOR | SUBCUTANEOUS | 1 refills | Status: DC

## 2022-04-08 ENCOUNTER — Other Ambulatory Visit: Payer: Self-pay | Admitting: Primary Care

## 2022-04-08 DIAGNOSIS — I10 Essential (primary) hypertension: Secondary | ICD-10-CM

## 2022-04-08 DIAGNOSIS — E785 Hyperlipidemia, unspecified: Secondary | ICD-10-CM

## 2022-05-22 LAB — HM DIABETES EYE EXAM

## 2022-06-06 ENCOUNTER — Encounter: Payer: Self-pay | Admitting: Primary Care

## 2022-06-06 ENCOUNTER — Telehealth: Payer: BC Managed Care – PPO | Admitting: Primary Care

## 2022-06-06 DIAGNOSIS — R197 Diarrhea, unspecified: Secondary | ICD-10-CM | POA: Insufficient documentation

## 2022-06-06 NOTE — Patient Instructions (Signed)
Pick up and complete the stool kits as discussed.  Increase your intake of liquids. This is very important.   Advance your diet slowly as tolerated.   Update me tomorrow afternoon.  It was a pleasure to see you today!

## 2022-06-06 NOTE — Progress Notes (Signed)
Patient ID: Cheyenne Flowers, female    DOB: 10/01/1960, 62 y.o.   MRN: [id number]  Virtual visit completed through [employer], a video enabled telemedicine application. Due to national recommendations of social distancing due to COVID-19, a virtual visit is felt to be most appropriate for this patient at this time. Reviewed limitations, risks, security and privacy concerns of performing a virtual visit and the availability of in person appointments. I also reviewed that there may be a patient responsible charge related to this service. The patient agreed to proceed.   Patient location: home Provider location: [place] at [facility], office Persons participating in this virtual visit: patient, provider   If any vitals were documented, they were collected by patient at home unless specified below.    There were no vitals taken for this visit.   CC: acute diarrhea Subjective:   HPI: Cheyenne Flowers is a 62 y.o. female presenting on 06/06/2022 for Diarrhea (Patient states she has been having diarrhea since 3 am Sunday. Francis Dowse vomited Sunday morning (only time) , stomach gurgling and diarrhea. Has had diarrhea any time attempts to eat. Has had diarrhea 5-6 times today. She denies any abdominal cramping. )  Symptom onset 3 days ago with recurrent diarrhea.  She is experiencing multiple episodes of diarrhea daily, has had about 5-6 episodes today.  She did experience 1 episode of vomiting 3 mornings ago, no vomiting since.  She denies abdominal pain but does experience a "gurgling" sensation to her abdomen with diarrhea.   She denies changes in her diet, eating different food, exposure to anyone with sickness. Several of her family members have have similar symptoms that have only lasted 24-48 hours. She has not taken anything OTC for her symptoms.   She sometimes experiences fecal incontinence with her diarrhea, has been wearing pads. She cannot come to our office given the recurrent diarrhea and  incontinence. She will experience diarrhea with eating most things. She's able to eat a few saltine crackers. She drank some chicken broth yesterday which increased her diarrhea today. She does have intermittent nausea.   She's sipping on water, Ginger Ale, and Body Armour with electrolytes. She has to sip slowly or she will develop diarrhea.   She's not taken her Ozempic in 2 weeks as it began making her feel odd. She denies recent antibiotic use, fevers. She has missed work today and is requesting to be out for the rest of this week.     Relevant past medical, surgical, family and social history reviewed and updated as indicated. Interim medical history since our last visit reviewed. Allergies and medications reviewed and updated. Outpatient Medications Prior to Visit  Medication Sig Dispense Refill   gabapentin (NEURONTIN) 600 MG tablet TAKE 1 TABLET (600 MG TOTAL) BY MOUTH AT BEDTIME. FOR NECK PAIN. 90 tablet 2   losartan (COZAAR) 25 MG tablet TAKE 1 TABLET BY MOUTH EVERY DAY FOR BLOOD PRESSURE 90 tablet 0   montelukast (SINGULAIR) 10 MG tablet TAKE 1 TABLET (10 MG TOTAL) BY MOUTH AT BEDTIME. FOR ALLERGIES. 90 tablet 2   nortriptyline (PAMELOR) 10 MG capsule TAKE 1 CAPSULE (10 MG TOTAL) BY MOUTH AT BEDTIME. FOR DIARRHEA. 90 capsule 2   atorvastatin (LIPITOR) 10 MG tablet TAKE 1 TABLET BY MOUTH EVERY DAY FOR CHOLESTEROL (Patient not taking: Reported on 06/06/2022) 90 tablet 0   cetirizine (ZYRTEC) 10 MG tablet TAKE 1 TABLET BY MOUTH EVERY DAY as needed for allergies. (Patient not taking: Reported on 06/06/2022) 90 tablet 3  benzonatate (TESSALON) 200 MG capsule Take 1 capsule (200 mg total) by mouth 3 (three) times daily as needed for cough. (Patient not taking: Reported on 02/09/2022) 15 capsule 0   Semaglutide, 1 MG/DOSE, 4 MG/3ML SOPN Inject 1 mg as directed once a week. for diabetes. (Patient not taking: Reported on 06/06/2022) 9 mL 1   No facility-administered medications prior to visit.      Per HPI unless specifically indicated in ROS section below Review of Systems  Constitutional:  Positive for fatigue. Negative for fever.  Gastrointestinal:  Positive for diarrhea and nausea. Negative for abdominal pain and vomiting.  Neurological:  Negative for headaches.   Objective:  There were no vitals taken for this visit.  Wt Readings from Last 3 Encounters:  02/09/22 171 lb (77.6 kg)  01/30/22 172 lb (78 kg)  01/24/22 168 lb (76.2 kg)       Physical exam: General: Alert and oriented x 3, no distress, does not appear sickly  Pulmonary: Speaks in complete sentences without increased work of breathing, no cough during visit.  Psychiatric: Normal mood, thought content, and behavior.     Results for orders placed or performed in visit on 02/09/22  POCT glycosylated hemoglobin (Hb A1C)  Result Value Ref Range   Hemoglobin A1C 5.8 (A) 4.0 - 5.6 %   HbA1c POC (<> result, manual entry)     HbA1c, POC (prediabetic range)     HbA1c, POC (controlled diabetic range)     Assessment & Plan:   Problem List Items Addressed This Visit       Digestive   Acute diarrhea - Primary    Symptoms representative of viral etiology.  Discussed the importance of increasing hydration for kidney function. Advance diet as tolerated with BRAT diet.  She declines to come in for labs as she will experience incontinence.  Ordered stool studies, her husband will come pick up.   She will update again tomorrow afternoon. Work note provided.       Relevant Orders   Giardia antigen   Gastrointestinal Pathogen Pnl RT, PCR   C. difficile GDH and Toxin A/B     No orders of the defined types were placed in this encounter.  Orders Placed This Encounter  Procedures   Giardia antigen    Standing Status:   Future    Standing Expiration Date:   06/06/2023   Gastrointestinal Pathogen Pnl RT, PCR    Standing Status:   Future    Standing Expiration Date:   06/06/2023   C. difficile GDH and Toxin  A/B    Standing Status:   Future    Standing Expiration Date:   06/06/2023    I discussed the assessment and treatment plan with the patient. The patient was provided an opportunity to ask questions and all were answered. The patient agreed with the plan and demonstrated an understanding of the instructions. The patient was advised to call back or seek an in-person evaluation if the symptoms worsen or if the condition fails to improve as anticipated.  Follow up plan:  Pick up and complete the stool kits as discussed.  Increase your intake of liquids. This is very important.   Advance your diet slowly as tolerated.   Update me tomorrow afternoon.  It was a pleasure to see you today!   Doreene Nest, NP

## 2022-06-06 NOTE — Assessment & Plan Note (Signed)
Symptoms representative of viral etiology.  Discussed the importance of increasing hydration for kidney function. Advance diet as tolerated with BRAT diet.  She declines to come in for labs as she will experience incontinence.  Ordered stool studies, her husband will come pick up.   She will update again tomorrow afternoon. Work note provided.

## 2022-06-07 ENCOUNTER — Other Ambulatory Visit: Payer: Self-pay | Admitting: Primary Care

## 2022-06-07 DIAGNOSIS — K529 Noninfective gastroenteritis and colitis, unspecified: Secondary | ICD-10-CM

## 2022-06-07 DIAGNOSIS — R053 Chronic cough: Secondary | ICD-10-CM

## 2022-06-08 NOTE — Telephone Encounter (Signed)
Patient is due for CPE/follow up in August. Please schedule, thank you!

## 2022-06-11 NOTE — Telephone Encounter (Signed)
LVM for patient to c/b and schedule.  

## 2022-06-14 NOTE — Telephone Encounter (Signed)
Patient scheduled cpe for 7/3

## 2022-06-22 ENCOUNTER — Encounter: Payer: Self-pay | Admitting: Primary Care

## 2022-06-22 ENCOUNTER — Telehealth (INDEPENDENT_AMBULATORY_CARE_PROVIDER_SITE_OTHER): Payer: BC Managed Care – PPO | Admitting: Primary Care

## 2022-06-22 VITALS — Temp 98.5°F | Ht 64.0 in | Wt 171.0 lb

## 2022-06-22 DIAGNOSIS — U071 COVID-19: Secondary | ICD-10-CM | POA: Diagnosis not present

## 2022-06-22 MED ORDER — NIRMATRELVIR/RITONAVIR (PAXLOVID)TABLET
3.0000 | ORAL_TABLET | Freq: Two times a day (BID) | ORAL | 0 refills | Status: AC
Start: 2022-06-22 — End: 2022-06-27

## 2022-06-22 MED ORDER — BENZONATATE 200 MG PO CAPS
200.0000 mg | ORAL_CAPSULE | Freq: Three times a day (TID) | ORAL | 0 refills | Status: DC | PRN
Start: 2022-06-22 — End: 2022-07-20

## 2022-06-22 MED ORDER — MOLNUPIRAVIR EUA 200MG CAPSULE
4.0000 | ORAL_CAPSULE | Freq: Two times a day (BID) | ORAL | 0 refills | Status: DC
Start: 2022-06-22 — End: 2022-06-22

## 2022-06-22 NOTE — Assessment & Plan Note (Signed)
Qualifies and is within window of treatment.  Start molnupirivir 200 mg capsules. Take 4 capsules by mouth twice daily.  Start [employer] for cough. Take 1 capsule by mouth three times daily as needed for cough.  Remain at home, she does not need a work note. Return precautions provided.

## 2022-06-22 NOTE — Telephone Encounter (Signed)
Called and scheduled patient appt 06/22/22 @ 9:00

## 2022-06-22 NOTE — Progress Notes (Signed)
Patient ID: Cheyenne Flowers, female    DOB: June 18, 1960, 62 y.o.   MRN: [id number]  Virtual visit completed through [employer], a video enabled telemedicine application. Due to national recommendations of social distancing due to COVID-19, a virtual visit is felt to be most appropriate for this patient at this time. Reviewed limitations, risks, security and privacy concerns of performing a virtual visit and the availability of in person appointments. I also reviewed that there may be a patient responsible charge related to this service. The patient agreed to proceed.   Patient location: home Provider location: [place] at [facility], office Persons participating in this virtual visit: patient, provider   If any vitals were documented, they were collected by patient at home unless specified below.    Temp 98.5 F (36.9 C) (Temporal)   Ht 5\' 4"  (1.626 m)   Wt 171 lb (77.6 kg)   BMI 29.35 kg/m    CC: Covid-19 positive Subjective:   HPI: Cheyenne Flowers is a 62 y.o. female with a history of type 2 diabetes, GERD, hypertension, chronic diarrhea presenting on 06/22/2022 for Covid Positive (Symptoms started Wednesday evening /Runny nose, cough, scratchy throat, low-grade fever, sweats, headache/Tested for Covid yesterday with home test-positive. )  Symptom onset two evenings ago with rhinorrhea, scratchy throat. She then developed a cough, low grade fever (t-max 99.9), headaches. She took a home Covid-19 test yesterday which was positive.   She's been taking Advil. Today she's feeling slightly worse with her cough which is now productive. She did take Paxlovid during her last episode of Covid-19 infection in January 2024, this caused a terrible taste in her mouth.   She denies shortness of breath, difficulty breathing.       Relevant past medical, surgical, family and social history reviewed and updated as indicated. Interim medical history since our last visit reviewed. Allergies and  medications reviewed and updated. Outpatient Medications Prior to Visit  Medication Sig Dispense Refill   gabapentin (NEURONTIN) 600 MG tablet TAKE 1 TABLET (600 MG TOTAL) BY MOUTH AT BEDTIME. FOR NECK PAIN. 90 tablet 2   losartan (COZAAR) 25 MG tablet TAKE 1 TABLET BY MOUTH EVERY DAY FOR BLOOD PRESSURE 90 tablet 0   montelukast (SINGULAIR) 10 MG tablet TAKE 1 TABLET (10 MG TOTAL) BY MOUTH AT BEDTIME. FOR ALLERGIES. 90 tablet 0   nortriptyline (PAMELOR) 10 MG capsule TAKE 1 CAPSULE (10 MG TOTAL) BY MOUTH AT BEDTIME. FOR DIARRHEA. 90 capsule 0   atorvastatin (LIPITOR) 10 MG tablet TAKE 1 TABLET BY MOUTH EVERY DAY FOR CHOLESTEROL (Patient not taking: Reported on 06/06/2022) 90 tablet 0   cetirizine (ZYRTEC) 10 MG tablet TAKE 1 TABLET BY MOUTH EVERY DAY as needed for allergies. (Patient not taking: Reported on 06/06/2022) 90 tablet 3   No facility-administered medications prior to visit.     Per HPI unless specifically indicated in ROS section below Review of Systems  Constitutional:  Positive for fatigue. Negative for fever.  HENT:  Positive for congestion, postnasal drip and rhinorrhea.   Respiratory:  Positive for cough. Negative for shortness of breath.   Neurological:  Positive for headaches.   Objective:  Temp 98.5 F (36.9 C) (Temporal)   Ht 5\' 4"  (1.626 m)   Wt 171 lb (77.6 kg)   BMI 29.35 kg/m   Wt Readings from Last 3 Encounters:  06/22/22 171 lb (77.6 kg)  02/09/22 171 lb (77.6 kg)  01/30/22 172 lb (78 kg)       Physical exam:  General: Alert and oriented x 3, no distress, does appear sickly  Pulmonary: Speaks in complete sentences without increased work of breathing, sounds congested.   Psychiatric: Normal mood, thought content, and behavior.     Results for orders placed or performed in visit on 02/09/22  POCT glycosylated hemoglobin (Hb A1C)  Result Value Ref Range   Hemoglobin A1C 5.8 (A) 4.0 - 5.6 %   HbA1c POC (<> result, manual entry)     HbA1c, POC  (prediabetic range)     HbA1c, POC (controlled diabetic range)     Assessment & Plan:   Problem List Items Addressed This Visit       Other   COVID-19 virus infection - Primary    Qualifies and is within window of treatment.  Start molnupirivir 200 mg capsules. Take 4 capsules by mouth twice daily.  Start [employer] for cough. Take 1 capsule by mouth three times daily as needed for cough.  Remain at home, she does not need a work note. Return precautions provided.       Relevant Medications   molnupiravir EUA (LAGEVRIO) 200 mg CAPS capsule   benzonatate (TESSALON) 200 MG capsule     Meds ordered this encounter  Medications   molnupiravir EUA (LAGEVRIO) 200 mg CAPS capsule    Sig: Take 4 capsules (800 mg total) by mouth 2 (two) times daily for 5 days.    Dispense:  40 capsule    Refill:  0    Order Specific Question:   Supervising Provider    Answer:   BEDSOLE, AMY E [[id number]]   benzonatate (TESSALON) 200 MG capsule    Sig: Take 1 capsule (200 mg total) by mouth 3 (three) times daily as needed for cough.    Dispense:  15 capsule    Refill:  0    Order Specific Question:   Supervising Provider    Answer:   BEDSOLE, AMY E [[id number]]   No orders of the defined types were placed in this encounter.   I discussed the assessment and treatment plan with the patient. The patient was provided an opportunity to ask questions and all were answered. The patient agreed with the plan and demonstrated an understanding of the instructions. The patient was advised to call back or seek an in-person evaluation if the symptoms worsen or if the condition fails to improve as anticipated.  Follow up plan:  You may take the molnupiravir antiviral treatment to help with COVID-19 infection.  Follow the package instructions.  You may take Benzonatate capsules for cough. Take 1 capsule by mouth three times daily as needed for cough.  It was a pleasure to see you today!  Doreene Nest, NP

## 2022-06-22 NOTE — Patient Instructions (Signed)
You may take the molnupiravir antiviral treatment to help with COVID-19 infection.  Follow the package instructions.  You may take Benzonatate capsules for cough. Take 1 capsule by mouth three times daily as needed for cough.  It was a pleasure to see you today!

## 2022-06-29 IMAGING — DX DG HIP (WITH OR WITHOUT PELVIS) 2-3V*R*
3 series · 3 of 3 positions shown · non-contrast
Comparison: None.

CLINICAL DATA: Chronic hip pain.

EXAM:
DG HIP (WITH OR WITHOUT PELVIS) 2-3V RIGHT

[pelvis ap]
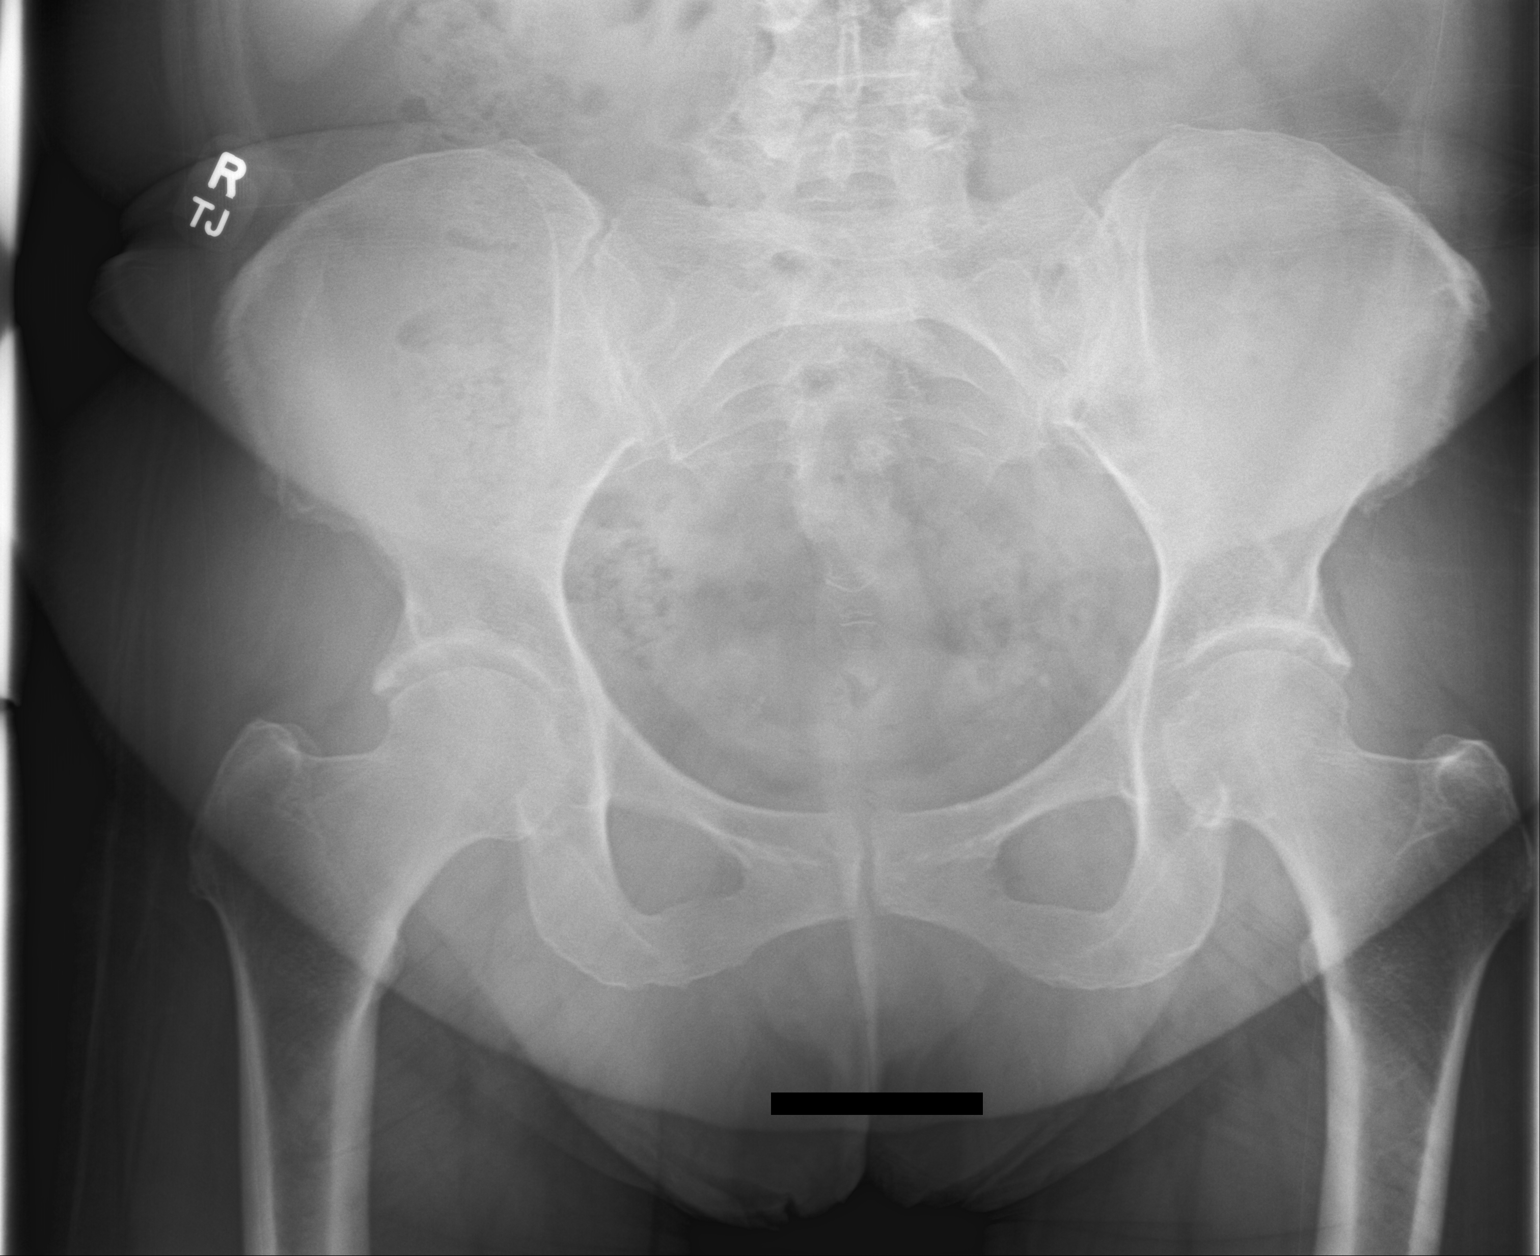

[hip ap]
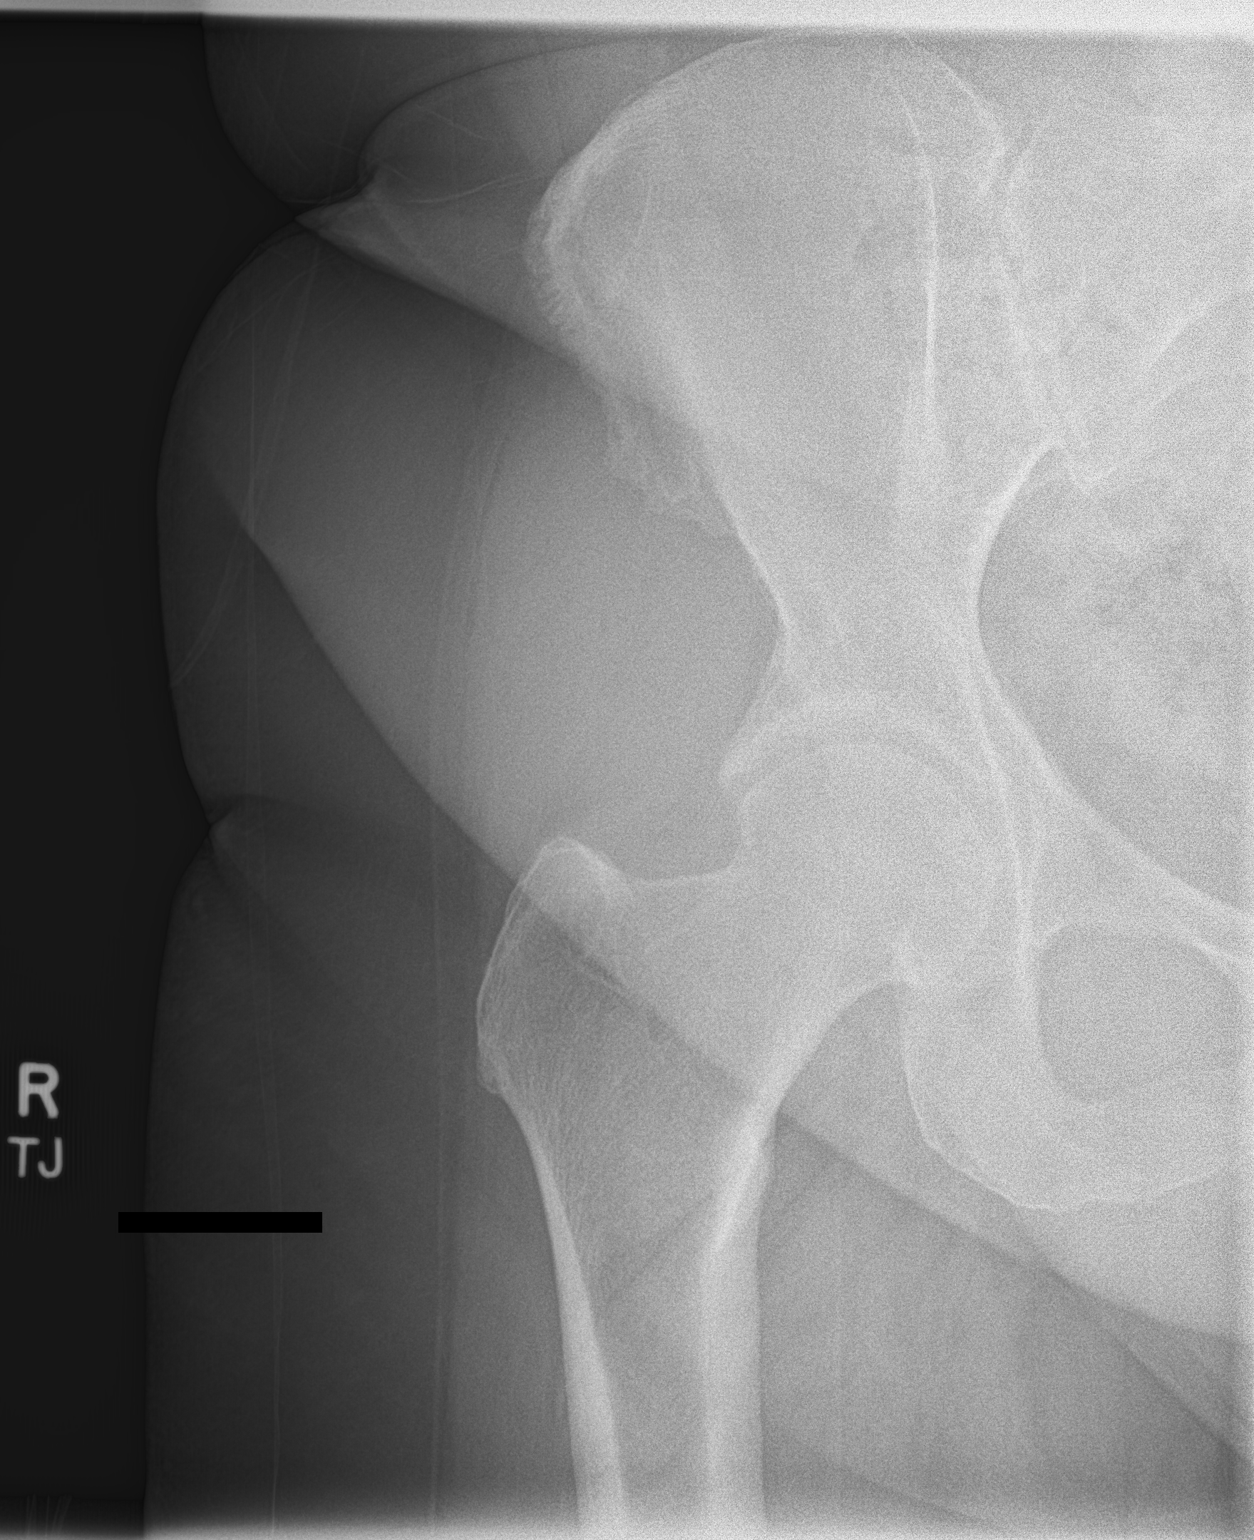

[hip lat]
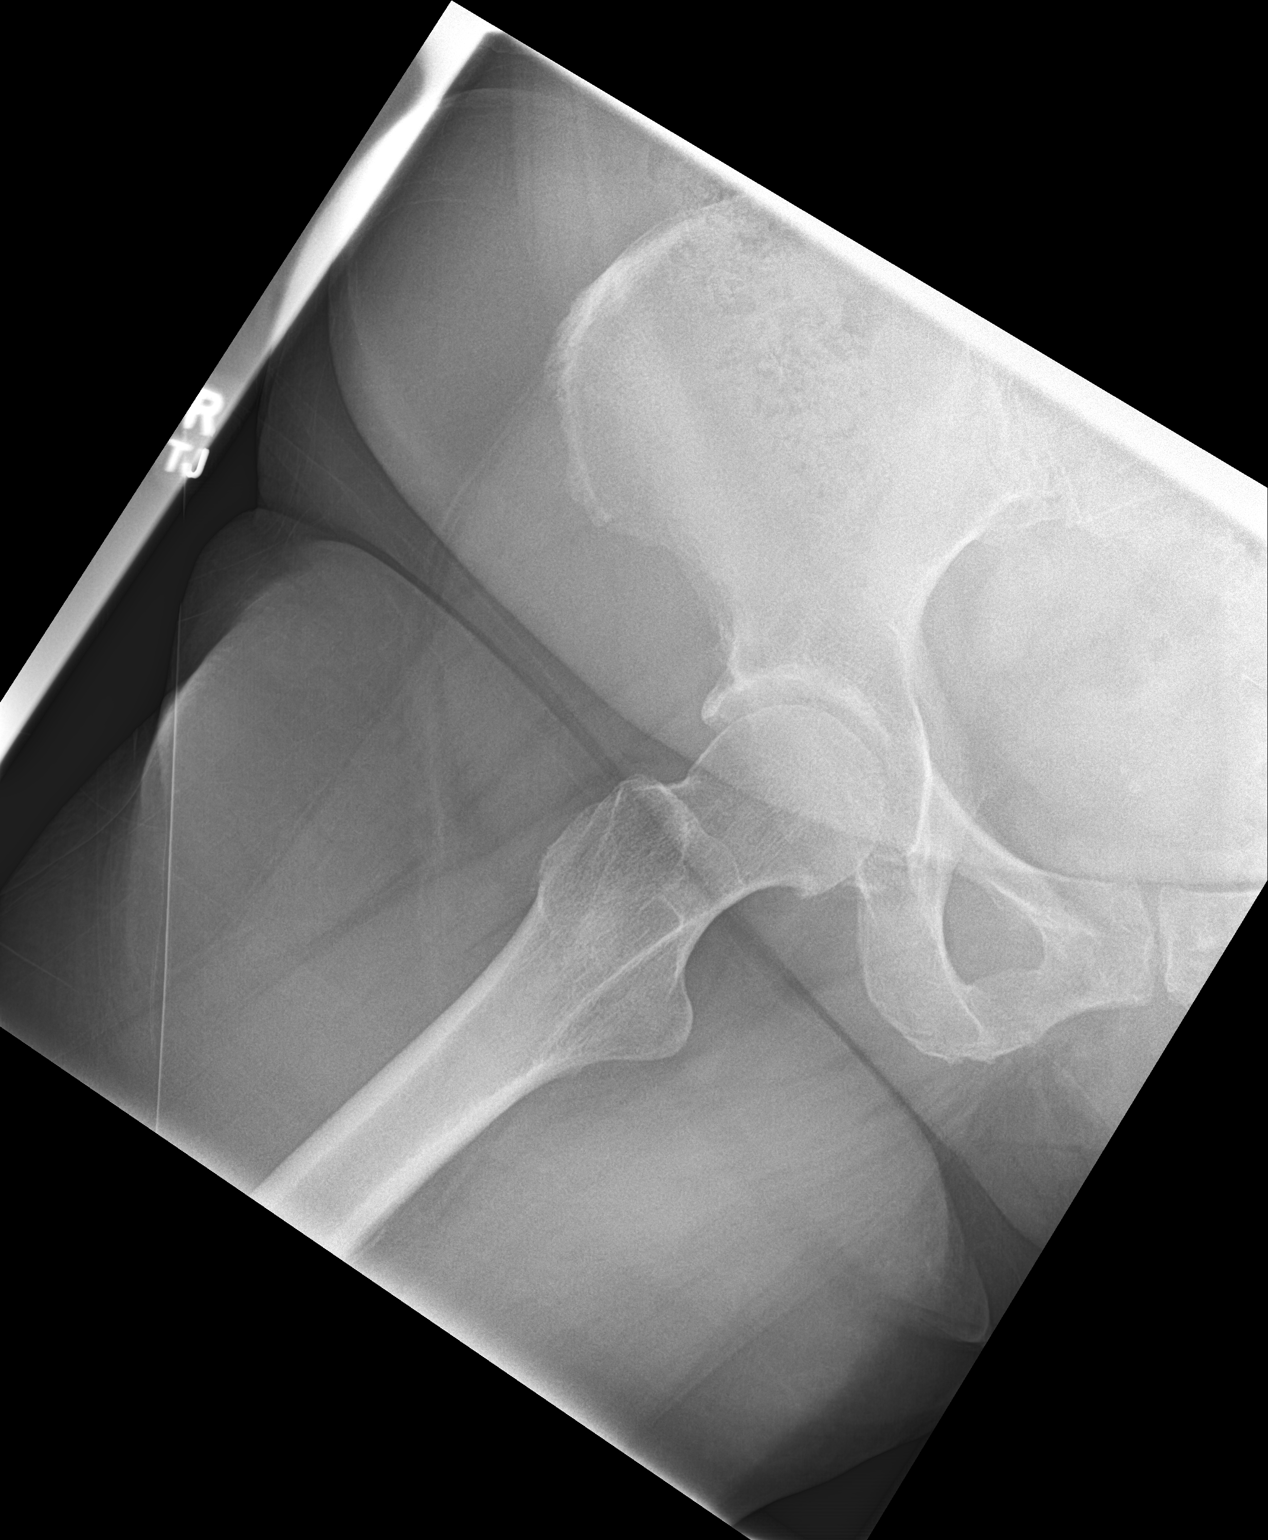

[3 of 3 positions shown; findings below may reference images not displayed]

FINDINGS: There is no evidence of hip fracture or dislocation. Mild spurring
noted on the humeral head with relative good preservation of joint
space in the right hip. SI joints and symphysis pubis unremarkable.
IMPRESSION: Mild degenerative changes.  Otherwise unremarkable.

## 2022-07-05 ENCOUNTER — Other Ambulatory Visit: Payer: Self-pay | Admitting: Primary Care

## 2022-07-05 DIAGNOSIS — I10 Essential (primary) hypertension: Secondary | ICD-10-CM

## 2022-07-05 DIAGNOSIS — E785 Hyperlipidemia, unspecified: Secondary | ICD-10-CM

## 2022-07-05 NOTE — Telephone Encounter (Signed)
Pt requested to schedule cpe July, scheduled cpe for 7/12

## 2022-07-05 NOTE — Telephone Encounter (Signed)
Patient is due for CPE/follow up in August. Please schedule, thank you!   

## 2022-07-07 ENCOUNTER — Other Ambulatory Visit: Payer: Self-pay | Admitting: Primary Care

## 2022-07-07 DIAGNOSIS — G8929 Other chronic pain: Secondary | ICD-10-CM

## 2022-07-11 ENCOUNTER — Encounter: Payer: BC Managed Care – PPO | Admitting: Primary Care

## 2022-07-20 ENCOUNTER — Encounter: Payer: Self-pay | Admitting: Primary Care

## 2022-07-20 ENCOUNTER — Ambulatory Visit (INDEPENDENT_AMBULATORY_CARE_PROVIDER_SITE_OTHER): Payer: BC Managed Care – PPO | Admitting: Primary Care

## 2022-07-20 VITALS — BP 130/76 | HR 115 | Temp 98.2°F | Ht 64.0 in | Wt 176.0 lb

## 2022-07-20 DIAGNOSIS — Z0000 Encounter for general adult medical examination without abnormal findings: Secondary | ICD-10-CM

## 2022-07-20 DIAGNOSIS — F419 Anxiety disorder, unspecified: Secondary | ICD-10-CM

## 2022-07-20 DIAGNOSIS — E1165 Type 2 diabetes mellitus with hyperglycemia: Secondary | ICD-10-CM | POA: Diagnosis not present

## 2022-07-20 DIAGNOSIS — F32A Depression, unspecified: Secondary | ICD-10-CM

## 2022-07-20 DIAGNOSIS — K529 Noninfective gastroenteritis and colitis, unspecified: Secondary | ICD-10-CM | POA: Diagnosis not present

## 2022-07-20 DIAGNOSIS — G8929 Other chronic pain: Secondary | ICD-10-CM

## 2022-07-20 DIAGNOSIS — R053 Chronic cough: Secondary | ICD-10-CM

## 2022-07-20 DIAGNOSIS — K219 Gastro-esophageal reflux disease without esophagitis: Secondary | ICD-10-CM

## 2022-07-20 DIAGNOSIS — I10 Essential (primary) hypertension: Secondary | ICD-10-CM | POA: Diagnosis not present

## 2022-07-20 DIAGNOSIS — M542 Cervicalgia: Secondary | ICD-10-CM

## 2022-07-20 DIAGNOSIS — E785 Hyperlipidemia, unspecified: Secondary | ICD-10-CM

## 2022-07-20 MED ORDER — TIRZEPATIDE 2.5 MG/0.5ML ~~LOC~~ SOAJ
2.5000 mg | SUBCUTANEOUS | 0 refills | Status: DC
Start: 2022-07-20 — End: 2022-08-07

## 2022-07-20 NOTE — Assessment & Plan Note (Signed)
Repeat A1C pending.  She stopped Ozempic several months ago due to side effects. She will resume atorvastatin 10 mg daily.  Will try Mounjaro 2.5 mg weekly x 4 weeks, then increase to 5 mg thereafter.  Follow up in 3-6 months

## 2022-07-20 NOTE — Assessment & Plan Note (Signed)
Controlled.  Continue to monitor.  

## 2022-07-20 NOTE — Progress Notes (Signed)
Subjective:    Patient ID: Cheyenne Flowers, female    DOB: 1960-06-06, 62 y.o.   MRN: [id number]  HPI  Cheyenne Flowers is a very pleasant 62 y.o. female who presents today for complete physical and follow up of chronic conditions.  Immunizations: -Tetanus: Completed in 2020 -Shingles: Completed Shingrix series -Pneumonia: Completed in 2019  Diet: Fair diet.  Exercise: No regular exercise.  Eye exam: Completes annually  Dental exam: Completes semi-annually    Pap Smear: September 2022 Mammogram: November 2022, she will schedule.   Colonoscopy: Completed in 2019, due 2024. She is aware.   BP Readings from Last 3 Encounters:  07/20/22 130/76  02/09/22 126/84  01/30/22 128/78    Wt Readings from Last 3 Encounters:  07/20/22 176 lb (79.8 kg)  06/22/22 171 lb (77.6 kg)  02/09/22 171 lb (77.6 kg)      Review of Systems  Constitutional:  Negative for unexpected weight change.  HENT:  Negative for rhinorrhea.   Respiratory:  Negative for cough and shortness of breath.   Cardiovascular:  Negative for chest pain.  Gastrointestinal:  Negative for constipation and diarrhea.  Genitourinary:  Negative for difficulty urinating.  Musculoskeletal:  Positive for arthralgias and neck pain.  Skin:  Negative for rash.  Allergic/Immunologic: Negative for environmental allergies.  Neurological:  Negative for dizziness and headaches.  Psychiatric/Behavioral:  The patient is not nervous/anxious.          Past Medical History:  Diagnosis Date   Acute foreign body of right ear canal 01/20/2019   Allergy    Anxiety and depression    Arthritis    Chronic neck pain    Complication of anesthesia    COVID-19 virus infection 01/24/2022   Diverticulosis    Generalized abdominal pain 05/16/2017   GERD (gastroesophageal reflux disease)    Hyperlipidemia    Hypertension    Impacted cerumen of right ear 01/30/2022   Laceration of right ear canal 01/20/2019   Nausea vomiting and  diarrhea 12/29/2021   PONV (postoperative nausea and vomiting)    Strep pharyngitis 12/29/2021   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HCC)    Type 2 diabetes mellitus (HCC)     Social History   Socioeconomic History   Marital status: Married    Spouse name: Not on file   Number of children: Not on file   Years of education: Not on file   Highest education level: Not on file  Occupational History   Not on file  Tobacco Use   Smoking status: Never   Smokeless tobacco: Never  Vaping Use   Vaping status: Never Used  Substance and Sexual Activity   Alcohol use: Never   Drug use: Never   Sexual activity: Not on file  Other Topics Concern   Not on file  Social History Narrative   Married.   5 children, 9 grandchildren.   Works in administration.    Enjoys watching her grandchildren, spending time with family.   Social Determinants of Health   Financial Resource Strain: Not on file  Food Insecurity: Not on file  Transportation Needs: Not on file  Physical Activity: Not on file  Stress: Not on file  Social Connections: Not on file  Intimate Partner Violence: Not on file    Past Surgical History:  Procedure Laterality Date   CERVICAL FUSION  2007   CHOLECYSTECTOMY     DG GALL BLADDER  1990   EXTERNAL EAR SURGERY  2008, 2017   ORIF HUMERUS FRACTURE Left  11/12/2019   Procedure: open reduction internal fixation of left greater tuberosity fracture;  Surgeon: Francena Hanly, MD;  Location: [facility];  Service: Orthopedics;  Laterality: Left;     Family History  Problem Relation Age of Onset   COPD Mother    Diabetes Mother    Heart disease Mother    Diabetes Brother    Hypertension Brother     Allergies  Allergen Reactions   Latex Hives and Rash   Clindamycin Rash   Codeine Nausea Only    Other reaction(s): Other (See Comments) Sick feeling    Quinolones Rash    Current Outpatient Medications on File Prior to Visit  Medication Sig Dispense Refill   cetirizine (ZYRTEC) 10  MG tablet TAKE 1 TABLET BY MOUTH EVERY DAY as needed for allergies. 90 tablet 3   gabapentin (NEURONTIN) 600 MG tablet TAKE 1 TABLET (600 MG TOTAL) BY MOUTH AT BEDTIME. FOR NECK PAIN. 90 tablet 0   losartan (COZAAR) 25 MG tablet TAKE 1 TABLET BY MOUTH EVERY DAY FOR BLOOD PRESSURE 90 tablet 0   montelukast (SINGULAIR) 10 MG tablet TAKE 1 TABLET (10 MG TOTAL) BY MOUTH AT BEDTIME. FOR ALLERGIES. 90 tablet 0   nortriptyline (PAMELOR) 10 MG capsule TAKE 1 CAPSULE (10 MG TOTAL) BY MOUTH AT BEDTIME. FOR DIARRHEA. 90 capsule 0   atorvastatin (LIPITOR) 10 MG tablet TAKE 1 TABLET BY MOUTH EVERY DAY FOR CHOLESTEROL (Patient not taking: Reported on 07/20/2022) 90 tablet 0   No current facility-administered medications on file prior to visit.    BP 130/76   Pulse (!) 115   Temp 98.2 F (36.8 C) (Temporal)   Ht 5\' 4"  (1.626 m)   Wt 176 lb (79.8 kg)   SpO2 99%   BMI 30.21 kg/m  Objective:   Physical Exam HENT:     Right Ear: Tympanic membrane and ear canal normal.     Left Ear: Tympanic membrane and ear canal normal.     Nose: Nose normal.  Eyes:     Conjunctiva/sclera: Conjunctivae normal.     Pupils: Pupils are equal, round, and reactive to light.  Neck:     Thyroid: No thyromegaly.  Cardiovascular:     Rate and Rhythm: Normal rate and regular rhythm.     Heart sounds: No murmur heard. Pulmonary:     Effort: Pulmonary effort is normal.     Breath sounds: Normal breath sounds. No rales.  Abdominal:     General: Bowel sounds are normal.     Palpations: Abdomen is soft.     Tenderness: There is no abdominal tenderness.  Musculoskeletal:        General: Normal range of motion.     Cervical back: Neck supple.  Lymphadenopathy:     Cervical: No cervical adenopathy.  Skin:    General: Skin is warm and dry.     Findings: No rash.  Neurological:     Mental Status: She is alert and oriented to person, place, and time.     Cranial Nerves: No cranial nerve deficit.     Deep Tendon Reflexes:  Reflexes are normal and symmetric.  Psychiatric:        Mood and Affect: Mood normal.           Assessment & Plan:  Preventative health care Assessment & Plan: Immunizations UTD. Pap smear UTD. Mammogram due, she is aware and will schedule Colonoscopy due, she is aware and will schedule  Discussed the importance of a  healthy diet and regular exercise in order for weight loss, and to reduce the risk of further co-morbidity.  Exam stable. Labs pending.  Follow up in 1 year for repeat physical.    Essential hypertension Assessment & Plan: Controlled.  Continue losartan 25 mg daily. CMP pending.  Orders: -     Comprehensive metabolic panel  Chronic diarrhea Assessment & Plan: Controlled.  Continue nortriptyline 10 mg daily.    Type 2 diabetes mellitus with hyperglycemia, without long-term current use of insulin (HCC) Assessment & Plan: Repeat A1C pending.  She stopped Ozempic several months ago due to side effects. She will resume atorvastatin 10 mg daily.  Will try Mounjaro 2.5 mg weekly x 4 weeks, then increase to 5 mg thereafter.  Follow up in 3-6 months  Orders: -     Tirzepatide; Inject 2.5 mg into the skin once a week. for diabetes.  Dispense: 2 mL; Refill: 0 -     Hemoglobin A1c  Anxiety and depression Assessment & Plan: Controlled.  Continue to monitor.  No concerns today.    Gastroesophageal reflux disease, unspecified whether esophagitis present Assessment & Plan: Controlled.  Continue to monitor.   Chronic cough Assessment & Plan: Controlled.  Continue singular 10 mg at bedtime.   Chronic neck pain Assessment & Plan: Controlled.  Continue gabapentin 600 mg at bedtime.   Hyperlipidemia, unspecified hyperlipidemia type Assessment & Plan: Discussed to resume atorvastatin for protection against heart disease/stroke.  Resume atorvastatin 10 mg daily.  Orders: -     Lipid panel        Doreene Nest, NP

## 2022-07-20 NOTE — Assessment & Plan Note (Signed)
Immunizations UTD. Pap smear UTD. Mammogram due, she is aware and will schedule Colonoscopy due, she is aware and will schedule  Discussed the importance of a healthy diet and regular exercise in order for weight loss, and to reduce the risk of further co-morbidity.  Exam stable. Labs pending.  Follow up in 1 year for repeat physical.

## 2022-07-20 NOTE — Assessment & Plan Note (Signed)
Controlled.  Continue nortriptyline 10 mg daily.

## 2022-07-20 NOTE — Assessment & Plan Note (Signed)
Controlled.  Continue gabapentin 600 mg at bedtime.

## 2022-07-20 NOTE — Assessment & Plan Note (Signed)
Controlled.  Continue to monitor.  No concerns today.

## 2022-07-20 NOTE — Assessment & Plan Note (Signed)
Discussed to resume atorvastatin for protection against heart disease/stroke.  Resume atorvastatin 10 mg daily.

## 2022-07-20 NOTE — Assessment & Plan Note (Signed)
Controlled.  Continue singular 10 mg at bedtime.

## 2022-07-20 NOTE — Patient Instructions (Signed)
[facility].   Schedule your colonoscopy.  Stop by the lab prior to leaving today. I will notify you of your results once received.   Start tirzepitide Greggory Keen) for diabetes/weight loss. Start by injecting 2.5 mg into the skin once weekly for 4 weeks, then increase to 5 mg once weekly thereafter. Please notify me once you've used your last 2.5 mg pen so that I can prescribe the next dose.   Please schedule a follow up visit for 6 months for a diabetes check.  It was a pleasure to see you today!

## 2022-07-20 NOTE — Assessment & Plan Note (Signed)
Controlled. ? ?Continue losartan 25 mg daily. ? ?CMP pending. ?

## 2022-07-21 LAB — COMPREHENSIVE METABOLIC PANEL
AG Ratio: 1.5 (calc) (ref 1.0–2.5)
ALT: 25 U/L (ref 6–29)
AST: 24 U/L (ref 10–35)
Albumin: 4.1 g/dL (ref 3.6–5.1)
Alkaline phosphatase (APISO): 78 U/L (ref 37–153)
BUN: 11 mg/dL (ref 7–25)
CO2: 24 mmol/L (ref 20–32)
Calcium: 9.8 mg/dL (ref 8.6–10.4)
Chloride: 106 mmol/L (ref 98–110)
Creat: 0.83 mg/dL (ref 0.50–1.03)
Globulin: 2.7 g/dL (calc) (ref 1.9–3.7)
Glucose, Bld: 86 mg/dL (ref 65–99)
Potassium: 4.4 mmol/L (ref 3.5–5.3)
Sodium: 141 mmol/L (ref 135–146)
Total Bilirubin: 0.5 mg/dL (ref 0.2–1.2)
Total Protein: 6.8 g/dL (ref 6.1–8.1)

## 2022-07-21 LAB — LIPID PANEL
Cholesterol: 252 mg/dL — ABNORMAL HIGH (ref <200)
HDL: 49 mg/dL — ABNORMAL LOW (ref >=50)
LDL Cholesterol (Calc): 153 mg/dL (calc) — ABNORMAL HIGH
Non-HDL Cholesterol (Calc): 203 mg/dL (calc) — ABNORMAL HIGH (ref <130)
Total CHOL/HDL Ratio: 5.1 (calc) — ABNORMAL HIGH (ref <5.0)
Triglycerides: 324 mg/dL — ABNORMAL HIGH (ref <150)

## 2022-07-21 LAB — HEMOGLOBIN A1C
Hgb A1c MFr Bld: 6.2 % of total Hgb — ABNORMAL HIGH (ref <5.7)
Mean Plasma Glucose: 131 mg/dL
eAG (mmol/L): 7.3 mmol/L

## 2022-08-07 DIAGNOSIS — E1165 Type 2 diabetes mellitus with hyperglycemia: Secondary | ICD-10-CM

## 2022-08-07 MED ORDER — TIRZEPATIDE 5 MG/0.5ML ~~LOC~~ SOAJ
5.0000 mg | SUBCUTANEOUS | 0 refills | Status: DC
Start: 2022-08-07 — End: 2022-10-11

## 2022-08-16 ENCOUNTER — Other Ambulatory Visit: Payer: Self-pay | Admitting: Primary Care

## 2022-08-16 DIAGNOSIS — E1165 Type 2 diabetes mellitus with hyperglycemia: Secondary | ICD-10-CM

## 2022-09-04 ENCOUNTER — Other Ambulatory Visit: Payer: Self-pay | Admitting: Primary Care

## 2022-09-04 DIAGNOSIS — K529 Noninfective gastroenteritis and colitis, unspecified: Secondary | ICD-10-CM

## 2022-09-04 DIAGNOSIS — R053 Chronic cough: Secondary | ICD-10-CM

## 2022-09-13 DIAGNOSIS — Z1231 Encounter for screening mammogram for malignant neoplasm of breast: Secondary | ICD-10-CM

## 2022-09-18 ENCOUNTER — Ambulatory Visit
Admission: RE | Admit: 2022-09-18 | Discharge: 2022-09-18 | Disposition: A | Discharge status: Home / Self Care | Payer: BC Managed Care – PPO | Source: Ambulatory Visit | Attending: Primary Care | Admitting: Primary Care

## 2022-09-18 DIAGNOSIS — Z1231 Encounter for screening mammogram for malignant neoplasm of breast: Secondary | ICD-10-CM | POA: Diagnosis present

## 2022-09-19 ENCOUNTER — Other Ambulatory Visit: Payer: Self-pay | Admitting: *Deleted

## 2022-09-19 ENCOUNTER — Inpatient Hospital Stay
Admission: RE | Admit: 2022-09-19 | Discharge: 2022-09-19 | Disposition: A | Discharge status: Home / Self Care | Payer: Self-pay | Source: Ambulatory Visit | Attending: Primary Care | Admitting: Primary Care

## 2022-09-19 ENCOUNTER — Encounter: Payer: Self-pay | Admitting: Primary Care

## 2022-09-19 DIAGNOSIS — Z1231 Encounter for screening mammogram for malignant neoplasm of breast: Secondary | ICD-10-CM

## 2022-10-07 ENCOUNTER — Other Ambulatory Visit: Payer: Self-pay | Admitting: Primary Care

## 2022-10-07 DIAGNOSIS — E785 Hyperlipidemia, unspecified: Secondary | ICD-10-CM

## 2022-10-07 DIAGNOSIS — I10 Essential (primary) hypertension: Secondary | ICD-10-CM

## 2022-10-11 DIAGNOSIS — E1165 Type 2 diabetes mellitus with hyperglycemia: Secondary | ICD-10-CM

## 2022-10-11 MED ORDER — TIRZEPATIDE 5 MG/0.5ML ~~LOC~~ SOAJ: 5.0000 mg | SUBCUTANEOUS | 0 refills | Status: DC

## 2022-10-18 ENCOUNTER — Other Ambulatory Visit: Payer: Self-pay | Admitting: Primary Care

## 2022-10-18 DIAGNOSIS — G8929 Other chronic pain: Secondary | ICD-10-CM

## 2022-12-12 ENCOUNTER — Ambulatory Visit: Payer: BC Managed Care – PPO | Admitting: Primary Care

## 2023-01-16 ENCOUNTER — Other Ambulatory Visit: Payer: Self-pay | Admitting: Medical Genetics

## 2023-01-17 ENCOUNTER — Other Ambulatory Visit: Payer: Self-pay | Admitting: Primary Care

## 2023-01-17 DIAGNOSIS — E1165 Type 2 diabetes mellitus with hyperglycemia: Secondary | ICD-10-CM

## 2023-02-11 ENCOUNTER — Telehealth: Payer: Self-pay | Admitting: Pharmacy Technician

## 2023-02-11 ENCOUNTER — Other Ambulatory Visit (HOSPITAL_COMMUNITY): Payer: Self-pay

## 2023-02-11 NOTE — Telephone Encounter (Signed)
Pharmacy Patient Advocate Encounter   Received notification from CoverMyMeds that prior authorization for Mounjaro 5MG /0.5ML auto-injectors is required/requested.   Insurance verification completed.   The patient is insured through [facility] .   Per test claim: PA required; PA started via CoverMyMeds. KEY [id number] . Waiting for clinical questions to populate.

## 2023-02-11 NOTE — Telephone Encounter (Signed)
 Clinical questions have been answered and PA submitted. PA currently Pending. Please be advised that most companies allow up to 30 days to make a decision. We will advise when a determination has been made, or follow up in 1 week.   Please reach out to our team, Rx Prior Auth Pool, if you haven't heard back in a week.

## 2023-02-13 NOTE — Telephone Encounter (Signed)
 This is incorrect and I am not sure where this is coming from.  She is doing very well on Mounjaro .  She could not tolerate Ozempic .  Can we run the PA?

## 2023-02-13 NOTE — Telephone Encounter (Signed)
 Pharmacy Patient Advocate Encounter  Received notification from [employer] that Prior Authorization for Mounjaro  5MG /0.5ML auto-injectors  has been DENIED.  Full denial letter will be uploaded to the media tab. See denial reason below.      PA #/Case ID/Reference #: [id number]

## 2023-02-14 ENCOUNTER — Telehealth: Payer: Self-pay | Admitting: Pharmacist

## 2023-02-14 ENCOUNTER — Other Ambulatory Visit: Payer: Self-pay | Admitting: Primary Care

## 2023-02-14 ENCOUNTER — Other Ambulatory Visit: Payer: No Typology Code available for payment source

## 2023-02-14 DIAGNOSIS — E1165 Type 2 diabetes mellitus with hyperglycemia: Secondary | ICD-10-CM

## 2023-02-14 LAB — POCT GLYCOSYLATED HEMOGLOBIN (HGB A1C): Hemoglobin A1C: 5.5 % (ref 4.0–5.6)

## 2023-02-14 NOTE — Telephone Encounter (Signed)
 Can we notify the patient regarding what is going on and we run the PA?

## 2023-02-14 NOTE — Telephone Encounter (Signed)
 The insurance denial states that the request was denied because the drug was not proven effective. Their review of the available chart documentation shows that the patient started Mounjaro  in July 2024, with the last recorded A1c drawn at that time, and no office visits since. The denial was likely due to a lack of supporting documentation demonstrating the patient's improvement on the medication. To strengthen an appeal for Mounjaro , a more recent A1c or updated office visit notes would be beneficial. Please advise on how you would like to proceed.  Thank you, Devere Pandy, PharmD Clinical Pharmacist  [place]  Direct Dial: [phone]

## 2023-02-14 NOTE — Telephone Encounter (Signed)
 Called and notified patient. Patient will be using her last pen this weekend. To avoid any lapses in doses she would like to come by for A1c lab visit. Per verbal orders from Mallie Gaskins, NP she will stop by for lab appt today to complete A1C so that we can resubmit PA

## 2023-02-14 NOTE — Telephone Encounter (Signed)
 Copied from CRM [id number]. Topic: Clinical - Prescription Issue >> Feb 14, 2023 11:05 AM Macario HERO wrote: Reason for CRM: Rosina from [facility] leaving a message for nurse/Doctor. Doctor prescribed tirzepatide  (MOUNJARO ) 5 MG/0.5ML Pen [[id number]] which needed a prior authorization and it was denied because the question about A1c levels they marked low. They can resubmit it by calling  [phone] and mark it as urgent and it will be done within 24hrs and help with approval.

## 2023-02-15 NOTE — Telephone Encounter (Signed)
 Patient has updated A1c reading in chart. Please resubmit PA

## 2023-02-18 ENCOUNTER — Other Ambulatory Visit (HOSPITAL_COMMUNITY): Payer: Self-pay

## 2023-02-18 ENCOUNTER — Telehealth: Payer: Self-pay

## 2023-02-18 NOTE — Telephone Encounter (Signed)
 Clinical questions have been answered, PA has been submitted

## 2023-02-18 NOTE — Telephone Encounter (Signed)
 Pharmacy Patient Advocate Encounter   Received notification from Physician's Office that prior authorization for Mounjaro  5MG /0.5ML auto-injectors  is required/requested.   Insurance verification completed.   The patient is insured through [facility] .   Per test claim: PA required; PA started via CoverMyMeds. KEY [id number] . Waiting for clinical questions to populate.

## 2023-02-18 NOTE — Telephone Encounter (Signed)
 Pharmacy Patient Advocate Encounter  Received notification from [facility] that Prior Authorization for MOUNJARO  5MG /0.5ML AUTO-INJECTORS has been APPROVED from 02/18/2023 to 02/17/2026. Ran test claim, Copay is $150.00. This test claim was processed through [facility]- copay amounts may vary at other pharmacies due to pharmacy/plan contracts, or as the patient moves through the different stages of their insurance plan.   Key: [id number]

## 2023-02-18 NOTE — Telephone Encounter (Signed)
 Attempting resubmission through [facility], waiting on clinical questions to populate, creating separate telephone encounter with further submission details, dated 02/18/2023

## 2023-02-21 ENCOUNTER — Other Ambulatory Visit (HOSPITAL_COMMUNITY): Payer: Self-pay

## 2023-02-26 ENCOUNTER — Ambulatory Visit: Payer: No Typology Code available for payment source | Admitting: Primary Care

## 2023-02-26 ENCOUNTER — Ambulatory Visit (INDEPENDENT_AMBULATORY_CARE_PROVIDER_SITE_OTHER)
Admission: RE | Admit: 2023-02-26 | Discharge: 2023-02-26 | Disposition: A | Discharge status: Home / Self Care | Payer: No Typology Code available for payment source | Source: Ambulatory Visit | Attending: Primary Care | Admitting: Primary Care

## 2023-02-26 ENCOUNTER — Encounter: Payer: Self-pay | Admitting: Primary Care

## 2023-02-26 VITALS — BP 136/86 | HR 97 | Temp 97.2°F | Ht 64.0 in | Wt 164.0 lb

## 2023-02-26 DIAGNOSIS — Z7985 Long-term (current) use of injectable non-insulin antidiabetic drugs: Secondary | ICD-10-CM

## 2023-02-26 DIAGNOSIS — M25551 Pain in right hip: Secondary | ICD-10-CM

## 2023-02-26 DIAGNOSIS — E1165 Type 2 diabetes mellitus with hyperglycemia: Secondary | ICD-10-CM | POA: Diagnosis not present

## 2023-02-26 DIAGNOSIS — G8929 Other chronic pain: Secondary | ICD-10-CM

## 2023-02-26 LAB — MICROALBUMIN / CREATININE URINE RATIO
Creatinine,U: 71.3 mg/dL
Microalb Creat Ratio: 9.8 mg/g (ref 0.0–30.0)
Microalb, Ur: <0.7 mg/dL (ref 0.0–1.9)

## 2023-02-26 NOTE — Progress Notes (Signed)
 Subjective:    Patient ID: Cheyenne Flowers, female    DOB: Aug 08, 1960, 63 y.o.   MRN: [id number]  HPI  Cheyenne Flowers is a very pleasant 63 y.o. female with a history of hypertension, type 2 diabetes, hyperlipidemia who presents today for follow up of diabetes. She would also like to discuss chronic hip pain.  1) Type 2 Diabetes: Current medications include: Mounjaro 5 mg weekly.   She is checking her blood glucose 0 times daily.  Last A1C: 6.2 in July 2024, 5.5 in February 2025 Last Eye Exam: UTD Last Foot Exam: Due Pneumonia Vaccination: 2019 Urine Microalbumin: Due Statin: atorvastatin   Dietary changes since last visit: None.    Exercise: Walking 15,000-17,000 steps five days weekly at work.  Wt Readings from Last 3 Encounters:  02/26/23 164 lb (74.4 kg)  07/20/22 176 lb (79.8 kg)  06/22/22 171 lb (77.6 kg)   2) Chronic Hip Pain: Chronic to the right hip for years. Four months ago she received a promotion at work which has made her more mobile.  She is now walking anywhere between 15,000-17,000 steps daily.  Over the last 6 weeks she has noticed a tremendous increase in her right hip pain which is mostly bothersome while walking at work.  When she comes home from work she noticed continued pain, especially when rising from a seated position after resting.  She underwent plain films of the right hip in 2022 which revealed mild degenerative changes, otherwise unremarkable.  Continue taking Tylenol arthritis strength with temporary improvement.    Review of Systems  Respiratory:  Negative for shortness of breath.   Cardiovascular:  Negative for chest pain.  Gastrointestinal:  Negative for abdominal pain and constipation.  Musculoskeletal:  Positive for arthralgias.  Neurological:  Negative for numbness.         Past Medical History:  Diagnosis Date   Acute foreign body of right ear canal 01/20/2019   Allergy    Anxiety and depression    Arthritis    Chronic  neck pain    Complication of anesthesia    COVID-19 virus infection 01/24/2022   Diverticulosis    Generalized abdominal pain 05/16/2017   GERD (gastroesophageal reflux disease)    Hyperlipidemia    Hypertension    Impacted cerumen of right ear 01/30/2022   Laceration of right ear canal 01/20/2019   Nausea vomiting and diarrhea 12/29/2021   PONV (postoperative nausea and vomiting)    Strep pharyngitis 12/29/2021   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HCC)    Type 2 diabetes mellitus (HCC)     Social History   Socioeconomic History   Marital status: Married    Spouse name: Not on file   Number of children: Not on file   Years of education: Not on file   Highest education level: Not on file  Occupational History   Not on file  Tobacco Use   Smoking status: Never   Smokeless tobacco: Never  Vaping Use   Vaping status: Never Used  Substance and Sexual Activity   Alcohol use: Never   Drug use: Never   Sexual activity: Not on file  Other Topics Concern   Not on file  Social History Narrative   Married.   5 children, 9 grandchildren.   Works in administration.    Enjoys watching her grandchildren, spending time with family.   Social Drivers of Corporate investment banker Strain: Not on file  Food Insecurity: Not on file  Transportation Needs: Not on  file  Physical Activity: Not on file  Stress: Not on file  Social Connections: Not on file  Intimate Partner Violence: Not on file    Past Surgical History:  Procedure Laterality Date   CERVICAL FUSION  2007   CHOLECYSTECTOMY     DG GALL BLADDER  1990   EXTERNAL EAR SURGERY  2008, 2017   ORIF HUMERUS FRACTURE Left 11/12/2019   Procedure: open reduction internal fixation of left greater tuberosity fracture;  Surgeon: Francena Hanly, MD;  Location: [facility];  Service: Orthopedics;  Laterality: Left;     Family History  Problem Relation Age of Onset   Breast cancer Mother 54   COPD Mother    Diabetes Mother    Heart disease  Mother    Diabetes Brother    Hypertension Brother     Allergies  Allergen Reactions   Latex Hives and Rash   Clindamycin Rash   Codeine Nausea Only    Other reaction(s): Other (See Comments) Sick feeling    Quinolones Rash    Current Outpatient Medications on File Prior to Visit  Medication Sig Dispense Refill   atorvastatin (LIPITOR) 10 MG tablet TAKE 1 TABLET BY MOUTH EVERY DAY FOR CHOLESTEROL 90 tablet 2   cetirizine (ZYRTEC) 10 MG tablet TAKE 1 TABLET BY MOUTH EVERY DAY as needed for allergies. 90 tablet 3   gabapentin (NEURONTIN) 600 MG tablet TAKE 1 TABLET (600 MG TOTAL) BY MOUTH AT BEDTIME. FOR NECK PAIN. 90 tablet 2   losartan (COZAAR) 25 MG tablet TAKE 1 TABLET BY MOUTH EVERY DAY FOR BLOOD PRESSURE 90 tablet 2   montelukast (SINGULAIR) 10 MG tablet TAKE 1 TABLET BY MOUTH AT BEDTIME FOR ALLERGIES 90 tablet 2   nortriptyline (PAMELOR) 10 MG capsule TAKE 1 CAPSULE (10 MG TOTAL) BY MOUTH AT BEDTIME. FOR DIARRHEA. 90 capsule 2   tirzepatide (MOUNJARO) 5 MG/0.5ML Pen INJECT 5 MG INTO THE SKIN ONCE A WEEK. FOR DIABETES. 6 mL 0   No current facility-administered medications on file prior to visit.    BP 136/86   Pulse 97   Temp (!) 97.2 F (36.2 C) (Temporal)   Ht 5\' 4"  (1.626 m)   Wt 164 lb (74.4 kg)   SpO2 96%   BMI 28.15 kg/m  Objective:   Physical Exam Cardiovascular:     Rate and Rhythm: Normal rate and regular rhythm.  Pulmonary:     Effort: Pulmonary effort is normal.     Breath sounds: Normal breath sounds.  Musculoskeletal:     Cervical back: Neck supple.     Right hip: Normal range of motion. Normal strength.  Skin:    General: Skin is warm and dry.  Neurological:     Mental Status: She is alert and oriented to person, place, and time.  Psychiatric:        Mood and Affect: Mood normal.           Assessment & Plan:  Chronic pain of right hip Assessment & Plan: Deteriorated. Likely from increased physical activity with new  promotion.  Reviewed DG right hip/pelvis xray from 2022, revealed mild arthritis   Plan: will update DG right hip/pelvis today Referral to orthopedist for evaluation/treatment   I evaluated patient, was consulted regarding treatment, and agree with assessment and plan per Julaine Fusi, MSN, FNP student.   Mayra Reel, NP-C   Orders: -     DG HIP UNILAT W OR W/O PELVIS 2-3 VIEWS RIGHT -  Ambulatory referral to Orthopedic Surgery  Type 2 diabetes mellitus with hyperglycemia, without long-term current use of insulin (HCC) Assessment & Plan: Improved with A1c 5.5% (02/14/23) Previous A1c 6.2% (07/2022)  Continue Mounjaro 5mg /0.50mL once weekly Continue with dietary modifications and routine physical activity   Foot exam completed today Microalbumin/creatinine urine, ratio - results pending  Reviewed signs and symptoms of hypoglycemia   Follow up in 6 months for physical exam  . I evaluated patient, was consulted regarding treatment, and agree with assessment and plan per Julaine Fusi, MSN, FNP student.   Mayra Reel, NP-C    Orders: -     Microalbumin / creatinine urine ratio        Doreene Nest, NP

## 2023-02-26 NOTE — Assessment & Plan Note (Addendum)
 Deteriorated. Likely from increased physical activity with new promotion.  Reviewed DG right hip/pelvis xray from 2022, revealed mild arthritis   Plan: will update DG right hip/pelvis today Referral to orthopedist for evaluation/treatment   I evaluated patient, was consulted regarding treatment, and agree with assessment and plan per Julaine Fusi, MSN, FNP student.   Mayra Reel, NP-C

## 2023-02-26 NOTE — Progress Notes (Signed)
 Established Patient Office Visit  Subjective   Patient ID: Cheyenne Flowers, female    DOB: 05-Nov-1960  Age: 63 y.o. MRN: [id number]  Chief Complaint  Patient presents with   Medical Management of Chronic Issues    HPI Ladene is a 63 year old female with type 2 diabetes mellitus, hypertension, hyperlipidemia, GERD, vitamin D deficiency who presents today for routine diabetes follow up.   Type 2 diabetes mellitus  Current medications include:  Tirzepatide 5mg /0.22mL once weekly  No episodes of hypoglycemia noted. No diarrhea. Occasional constipation with BM 3x/weekly. Denies polyuria, polydipsia, polyphagia. Denies numbness/tingling and change in sensation to bilateral lower extremities.   Last A1C: 5.5% (02/14/2023) Last Eye Exam: UTD Last Foot Exam: due today Pneumonia Vaccination: UTD (completed in 2019) Urine Microalbumin: due today Statin: atorvastatin 10mg  daily   Diet currently consists of:  Breakfast: protein shake, occasional honey bun Lunch: uncrustable, cheese/chicken salad Dinner: variable; salad, chicken Snacks: fresh fruit, greek yogurt, occasional chex mix Desserts: occasional, maybe once weekly Beverages: water, light body armour, occasional sweet tea   Exercise: walks at work 5 days/week ~ 17,000 steps/daily; would like to lose ~10lbs  2. Right hip pain  Reports chronic right hip/groin pain that has worsened over the last 5-6 weeks. 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 She takes Tyelnol Arthritis strength for pain, with temporary relief. Pain is exacerbated by physical activity. Pain noticeable with rising from seated position.   She had a DG right hip/pelvis in 2022 that demonstrated mild arthritis. Orthopedist referral and/or PT was suggested at that time.   BP Readings from Last 3 Encounters:  02/26/23 136/86  07/20/22 130/76  02/09/22 126/84   Wt Readings from Last 3  Encounters:  02/26/23 74.4 kg  07/20/22 79.8 kg  06/22/22 77.6 kg      Reviewed s/sx of hypolgycmeia     Review of Systems  Constitutional: Negative.   Eyes: Negative.   Respiratory:  Negative for shortness of breath.   Cardiovascular:  Negative for chest pain and palpitations.  Gastrointestinal:  Positive for constipation. Negative for diarrhea, nausea and vomiting.       BM 3x/weekly  Genitourinary:  Negative for frequency and urgency.  Musculoskeletal:  Positive for joint pain.  Neurological:  Negative for tingling and sensory change.      Objective:     BP 136/86   Pulse 97   Temp (!) 97.2 F (36.2 C) (Temporal)   Ht 5\' 4"  (1.626 m)   Wt 74.4 kg   SpO2 96%   BMI 28.15 kg/m    Physical Exam Constitutional:      Appearance: Normal appearance.  Cardiovascular:     Rate and Rhythm: Normal rate and regular rhythm.     Pulses: Normal pulses.          Radial pulses are 2+ on the right side and 2+ on the left side.       Dorsalis pedis pulses are 2+ on the right side and 2+ on the left side.       Posterior tibial pulses are 2+ on the right side and 2+ on the left side.     Heart sounds: Normal heart sounds, S1 normal and S2 normal.  Pulmonary:     Effort: Pulmonary effort is normal.     Breath sounds: Normal breath sounds.  Musculoskeletal:     Cervical back: Normal range of motion and neck supple.  Right hip: No deformity or tenderness. Normal range of motion.     Left hip: No deformity or tenderness. Normal range of motion.     Right lower leg: No edema.     Left lower leg: No edema.     Right foot: No deformity.     Left foot: No deformity.  Feet:     Right foot:     Skin integrity: Skin integrity normal.     Left foot:     Skin integrity: Skin integrity normal.  Skin:    General: Skin is warm and dry.  Neurological:     General: No focal deficit present.     Mental Status: She is alert and oriented to person, place, and time.     Sensory:  Sensation is intact.     Coordination: Coordination is intact.     Gait: Gait is intact.  Psychiatric:        Mood and Affect: Mood normal.        Behavior: Behavior normal.        Thought Content: Thought content normal.     No results found for any visits on 02/26/23.    The 10-year ASCVD risk score (Arnett DK, et al., 2019) is: 12.1%    Assessment & Plan:   Problem List Items Addressed This Visit       Endocrine   Type 2 diabetes mellitus with hyperglycemia (HCC)   Improved with A1c 5.5% (02/14/23) Previous A1c 6.2% (07/2022)  Continue Mounjaro 5mg /0.69mL once weekly Continue with dietary modifications and routine physical activity   Foot exam completed today Microalbumin/creatinine urine, ratio - results pending  Reviewed signs and symptoms of hypoglycemia   Follow up in 6 months for physical exam         Relevant Orders   Microalbumin/Creatinine Ratio, Urine     Other   Chronic hip pain - Primary   Worsening chronic right hip pain x5-6 weeks  Reviewed DG right hip/pelvis xray from 2022, revealed mild arthritis   Plan: will update DG right hip/pelvis today Referral to orthopedist for evaluation/treatment       Relevant Orders   DG Hip Unilat W OR W/O Pelvis 2-3 Views Right   Ambulatory referral to Orthopedic Surgery    Follow up in 6 months for physical   Follow up with orthopedist for evaluation    Lindell Spar, RN

## 2023-02-26 NOTE — Patient Instructions (Addendum)
 You will either be contacted via phone regarding your referral to orthopedics, or you may receive a letter on your MyChart portal from our referral team with instructions for scheduling an appointment. Please let us know if you have not been contacted by anyone within two weeks.  Complete xray(s) prior to leaving today. I will notify you of your results once received.  Please schedule a physical to meet with me in 6 months.   It was a pleasure to see you today!

## 2023-02-26 NOTE — Assessment & Plan Note (Addendum)
 Improved with A1c 5.5% (02/14/23) Previous A1c 6.2% (07/2022)  Continue Mounjaro 5mg /0.71mL once weekly Continue with dietary modifications and routine physical activity   Foot exam completed today Microalbumin/creatinine urine, ratio - results pending  Reviewed signs and symptoms of hypoglycemia   Follow up in 6 months for physical exam  . I evaluated patient, was consulted regarding treatment, and agree with assessment and plan per Julaine Fusi, MSN, FNP student.   Mayra Reel, NP-C

## 2023-03-06 NOTE — Telephone Encounter (Signed)
 Can we get her x-ray read by radiology?

## 2023-05-15 ENCOUNTER — Other Ambulatory Visit: Payer: Self-pay | Admitting: Primary Care

## 2023-05-15 DIAGNOSIS — E1165 Type 2 diabetes mellitus with hyperglycemia: Secondary | ICD-10-CM

## 2023-05-16 ENCOUNTER — Other Ambulatory Visit (HOSPITAL_COMMUNITY): Payer: Self-pay

## 2023-05-17 ENCOUNTER — Other Ambulatory Visit (HOSPITAL_COMMUNITY): Payer: Self-pay

## 2023-05-21 ENCOUNTER — Other Ambulatory Visit (HOSPITAL_COMMUNITY): Payer: Self-pay

## 2023-05-22 ENCOUNTER — Telehealth: Payer: Self-pay

## 2023-05-22 ENCOUNTER — Other Ambulatory Visit (HOSPITAL_COMMUNITY): Payer: Self-pay

## 2023-05-22 NOTE — Telephone Encounter (Signed)
 PA request has been Submitted. New Encounter has been or will be created for follow up. For additional info see Pharmacy Prior Auth telephone encounter from 05/22/2023.

## 2023-05-22 NOTE — Telephone Encounter (Signed)
 Pharmacy Patient Advocate Encounter   Received notification from Patient Advice Request messages that prior authorization for Mounjaro  5MG /0.5ML auto-injectors is required/requested.   Insurance verification completed.   The patient is insured through [facility] .   Per test claim: PA required; PA submitted to above mentioned insurance via CoverMyMeds Key/confirmation #/EOC [id number] Status is pending

## 2023-05-24 ENCOUNTER — Other Ambulatory Visit (HOSPITAL_COMMUNITY): Payer: Self-pay

## 2023-05-29 ENCOUNTER — Other Ambulatory Visit (HOSPITAL_COMMUNITY): Payer: Self-pay

## 2023-06-03 ENCOUNTER — Other Ambulatory Visit: Payer: Self-pay | Admitting: Primary Care

## 2023-06-03 DIAGNOSIS — R053 Chronic cough: Secondary | ICD-10-CM

## 2023-06-03 DIAGNOSIS — K529 Noninfective gastroenteritis and colitis, unspecified: Secondary | ICD-10-CM

## 2023-06-04 NOTE — Telephone Encounter (Signed)
 Can someone follow-up regarding this PA?  She has been on Mounjaro  for diabetes for nearly 1 year.

## 2023-06-05 ENCOUNTER — Other Ambulatory Visit (HOSPITAL_COMMUNITY): Payer: Self-pay

## 2023-06-05 NOTE — Telephone Encounter (Signed)
 Pharmacy Patient Advocate Encounter  Received notification from [employer] that Prior Authorization for Mounjaro  has been Approved, no approval letter received, approval learned from test claim   Ran test claim for Mounjaro . Currently a quantity of 2ml is a 28 day supply and the co-pay is $3,348.46 .   This test claim was processed through [facility]- copay amounts may vary at other pharmacies due to pharmacy/plan contracts, or as the patient moves through the different stages of their insurance plan.

## 2023-06-06 ENCOUNTER — Other Ambulatory Visit (HOSPITAL_COMMUNITY): Payer: Self-pay

## 2023-06-06 DIAGNOSIS — K529 Noninfective gastroenteritis and colitis, unspecified: Secondary | ICD-10-CM

## 2023-06-06 MED ORDER — NORTRIPTYLINE HCL 10 MG PO CAPS
10.0000 mg | ORAL_CAPSULE | Freq: Every day | ORAL | 0 refills | Status: DC
Start: 2023-06-06 — End: 2023-09-18

## 2023-06-06 NOTE — Telephone Encounter (Signed)
 PA team: This patient has an approval listed in their chart documented 5/28 for the mounjaro . Please advise why is the pharmacy not seeing the approval.

## 2023-06-10 NOTE — Telephone Encounter (Signed)
 Please see separate encounter, as PA has been approved

## 2023-06-19 ENCOUNTER — Other Ambulatory Visit (HOSPITAL_COMMUNITY): Payer: Self-pay

## 2023-06-19 ENCOUNTER — Telehealth: Payer: Self-pay | Admitting: Pharmacist

## 2023-06-19 NOTE — Telephone Encounter (Signed)
 At insurance request, a letter requesting a redetermination of Mounjaro  has been faxed to the insurance company at [phone] on 06/19/2023 @10 :50 am.  Thank you, Dene Fines, PharmD Clinical Pharmacist  [place]  Direct Dial: [phone]

## 2023-06-24 ENCOUNTER — Other Ambulatory Visit (HOSPITAL_COMMUNITY): Payer: Self-pay

## 2023-06-28 LAB — HM DIABETES EYE EXAM

## 2023-07-01 ENCOUNTER — Other Ambulatory Visit (HOSPITAL_COMMUNITY): Payer: Self-pay

## 2023-07-03 ENCOUNTER — Other Ambulatory Visit (HOSPITAL_COMMUNITY): Payer: Self-pay

## 2023-07-04 ENCOUNTER — Other Ambulatory Visit: Payer: Self-pay | Admitting: Primary Care

## 2023-07-04 DIAGNOSIS — I10 Essential (primary) hypertension: Secondary | ICD-10-CM

## 2023-07-16 ENCOUNTER — Other Ambulatory Visit (HOSPITAL_COMMUNITY): Payer: Self-pay

## 2023-07-17 ENCOUNTER — Ambulatory Visit: Payer: Self-pay | Admitting: Primary Care

## 2023-07-17 ENCOUNTER — Encounter: Payer: Self-pay | Admitting: Primary Care

## 2023-07-17 VITALS — BP 102/60 | HR 98 | Temp 97.9°F | Ht 64.0 in | Wt 169.0 lb

## 2023-07-17 DIAGNOSIS — E1165 Type 2 diabetes mellitus with hyperglycemia: Secondary | ICD-10-CM

## 2023-07-17 DIAGNOSIS — B351 Tinea unguium: Secondary | ICD-10-CM

## 2023-07-17 DIAGNOSIS — R5383 Other fatigue: Secondary | ICD-10-CM | POA: Diagnosis not present

## 2023-07-17 NOTE — Assessment & Plan Note (Signed)
 Differentials include metabolic cause, diabetes, reaction from the recent hot weather, sleep apnea. Lower likelihood for CAD  Epworth sleepiness scale score of 3 today.  Checking labs today including CMP, CBC, iron studies, TSH, A1c. Await results.  Consider sleep study if symptoms persist and labs are unremarkable.

## 2023-07-17 NOTE — Assessment & Plan Note (Signed)
 Evident on exam today.  CMP pending for liver enzyme evaluation.  If liver enzymes are within normal range then we will start terbinafine to 250 mg daily x 1 to 3 months. Await results.

## 2023-07-17 NOTE — Progress Notes (Addendum)
 Subjective:    Patient ID: Cheyenne Flowers, female    DOB: February 26, 1960, 63 y.o.   MRN: [id number]  HPI  Cheyenne Flowers is a very pleasant 63 y.o. female with a history of type 2 diabetes, hypertension, hyperlipidemia, fatigue, chronic hip pain who presents today to discuss several concerns.  1) Toenail Abnormality: She believes she has toenail fungus to multiple toes on the right foot and a fungus in between all of her toes. Her toenails have looked like this for about 1 year despite multiple OTC treatments. The fungus in between her toes has been present most of her life. She was prescribed a cream in the past for the fungus in between her toes with improvement.   She denies pain, skin breakdown.   2) Chronic Fatigue: Acute on chronic for the last 3 weeks with generalized fatigue. Her most recent episode which began 6 days ago. She was walking from her car into the hair salon, felt very drained and fatigued. Had to sit and rest.   Today she feels tired, like she could go to bed and fall asleep. She doesn't sleep well at night, does snore a little, does get up several times during the night to urinate. She has never undergone a sleep study.  She denies vaginal or rectal bleeding, exertional chest pain or shortness of breath. The only change she can remember was being off Mounjaro  for 4 weeks, then resumed about 1 month ago.   Review of Systems  Constitutional:  Positive for fatigue.  Respiratory:  Negative for shortness of breath.   Cardiovascular:  Negative for chest pain.  Gastrointestinal:  Negative for blood in stool.  Genitourinary:  Negative for vaginal bleeding.  Skin:        Onychomycosis         Past Medical History:  Diagnosis Date   Acute foreign body of right ear canal 01/20/2019   Allergy    Anxiety and depression    Arthritis    Chronic neck pain    Complication of anesthesia    COVID-19 virus infection 01/24/2022   Diverticulosis    Generalized abdominal  pain 05/16/2017   GERD (gastroesophageal reflux disease)    Hyperlipidemia    Hypertension    Impacted cerumen of right ear 01/30/2022   Laceration of right ear canal 01/20/2019   Nausea vomiting and diarrhea 12/29/2021   PONV (postoperative nausea and vomiting)    Strep pharyngitis 12/29/2021   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HCC)    Type 2 diabetes mellitus (HCC)     Social History   Socioeconomic History   Marital status: Married    Spouse name: Not on file   Number of children: Not on file   Years of education: Not on file   Highest education level: Not on file  Occupational History   Not on file  Tobacco Use   Smoking status: Never   Smokeless tobacco: Never  Vaping Use   Vaping status: Never Used  Substance and Sexual Activity   Alcohol use: Never   Drug use: Never   Sexual activity: Not on file  Other Topics Concern   Not on file  Social History Narrative   Married.   5 children, 9 grandchildren.   Works in administration.    Enjoys watching her grandchildren, spending time with family.   Social Drivers of Corporate investment banker Strain: Not on file  Food Insecurity: Not on file  Transportation Needs: Not on file  Physical Activity: Not  on file  Stress: Not on file  Social Connections: Not on file  Intimate Partner Violence: Not on file    Past Surgical History:  Procedure Laterality Date   CERVICAL FUSION  2007   CHOLECYSTECTOMY     DG GALL BLADDER  1990   EXTERNAL EAR SURGERY  2008, 2017   ORIF HUMERUS FRACTURE Left 11/12/2019   Procedure: open reduction internal fixation of left greater tuberosity fracture;  Surgeon: Melita Drivers, MD;  Location: [facility];  Service: Orthopedics;  Laterality: Left;     Family History  Problem Relation Age of Onset   Breast cancer Mother 32   COPD Mother    Diabetes Mother    Heart disease Mother    Diabetes Brother    Hypertension Brother     Allergies  Allergen Reactions   Latex Hives and Rash    Clindamycin Rash   Codeine Nausea Only    Other reaction(s): Other (See Comments) Sick feeling    Quinolones Rash    Current Outpatient Medications on File Prior to Visit  Medication Sig Dispense Refill   atorvastatin  (LIPITOR) 10 MG tablet TAKE 1 TABLET BY MOUTH EVERY DAY FOR CHOLESTEROL 90 tablet 2   cetirizine  (ZYRTEC ) 10 MG tablet TAKE 1 TABLET BY MOUTH EVERY DAY as needed for allergies. 90 tablet 3   gabapentin  (NEURONTIN ) 600 MG tablet TAKE 1 TABLET (600 MG TOTAL) BY MOUTH AT BEDTIME. FOR NECK PAIN. 90 tablet 2   losartan  (COZAAR ) 25 MG tablet TAKE 1 TABLET BY MOUTH EVERY DAY FOR BLOOD PRESSURE 90 tablet 0   montelukast  (SINGULAIR ) 10 MG tablet TAKE 1 TABLET BY MOUTH AT BEDTIME FOR ALLERGIES 90 tablet 0   nortriptyline  (PAMELOR ) 10 MG capsule Take 1 capsule (10 mg total) by mouth at bedtime. For diarrhea. 90 capsule 0   tirzepatide  (MOUNJARO ) 5 MG/0.5ML Pen INJECT 5 MG INTO THE SKIN ONCE A WEEK. FOR DIABETES. 6 mL 0   No current facility-administered medications on file prior to visit.    BP 102/60   Pulse 98   Temp 97.9 F (36.6 C) (Temporal)   Ht 5' 4 (1.626 m)   Wt 169 lb (76.7 kg)   SpO2 98%   BMI 29.01 kg/m  Objective:   Physical Exam Cardiovascular:     Rate and Rhythm: Normal rate and regular rhythm.  Pulmonary:     Effort: Pulmonary effort is normal.     Breath sounds: Normal breath sounds.  Musculoskeletal:     Cervical back: Neck supple.  Skin:    General: Skin is warm and dry.     Comments: Thickened, yellow toenails noted to bilateral feet affecting most toes.  Mild scaling to skin in between 1st and 2nd toes bilaterally.  Neurological:     Mental Status: She is alert and oriented to person, place, and time.  Psychiatric:        Mood and Affect: Mood normal.           Assessment & Plan:  Fatigue, unspecified type Assessment & Plan: Differentials include metabolic cause, diabetes, reaction from the recent hot weather, sleep apnea. Lower  likelihood for CAD  Epworth sleepiness scale score of 3 today.  Checking labs today including CMP, CBC, iron studies, TSH, A1c. Await results.  Consider sleep study if symptoms persist and labs are unremarkable.  Orders: -     Comprehensive metabolic panel with GFR -     CBC -     IBC + Ferritin -  TSH  Type 2 diabetes mellitus with hyperglycemia, without long-term current use of insulin (HCC) -     Hemoglobin A1c  Onychomycosis Assessment & Plan: Evident on exam today.  CMP pending for liver enzyme evaluation.  If liver enzymes are within normal range then we will start terbinafine to 250 mg daily x 1 to 3 months. Await results.         Deserie Dirks K Tyrhonda Georgiades, NP

## 2023-07-17 NOTE — Patient Instructions (Signed)
 Stop by the lab prior to leaving today. I will notify you of your results once received.   If your liver enzymes are within normal range then we will start terbinafine 250 mg once daily for 1 to 3 months.  It was a pleasure to see you today!

## 2023-07-18 ENCOUNTER — Ambulatory Visit: Payer: Self-pay | Admitting: Primary Care

## 2023-07-18 DIAGNOSIS — B351 Tinea unguium: Secondary | ICD-10-CM

## 2023-07-18 LAB — COMPREHENSIVE METABOLIC PANEL WITH GFR
ALT: 12 U/L (ref 0–35)
AST: 14 U/L (ref 0–37)
Albumin: 4.1 g/dL (ref 3.5–5.2)
Alkaline Phosphatase: 71 U/L (ref 39–117)
BUN: 21 mg/dL (ref 6–23)
CO2: 30 meq/L (ref 19–32)
Calcium: 9.1 mg/dL (ref 8.4–10.5)
Chloride: 104 meq/L (ref 96–112)
Creatinine, Ser: 0.9 mg/dL (ref 0.40–1.20)
GFR: 69.43 mL/min (ref >60.00)
Glucose, Bld: 112 mg/dL — ABNORMAL HIGH (ref 70–99)
Potassium: 3.9 meq/L (ref 3.5–5.1)
Sodium: 140 meq/L (ref 135–145)
Total Bilirubin: 0.8 mg/dL (ref 0.2–1.2)
Total Protein: 6.5 g/dL (ref 6.0–8.3)

## 2023-07-18 LAB — HEMOGLOBIN A1C: Hgb A1c MFr Bld: 5.6 % (ref 4.6–6.5)

## 2023-07-18 LAB — CBC
HCT: 38.6 % (ref 36.0–46.0)
Hemoglobin: 12.8 g/dL (ref 12.0–15.0)
MCHC: 33.1 g/dL (ref 30.0–36.0)
MCV: 92.6 fl (ref 78.0–100.0)
Platelets: 326 K/uL (ref 150.0–400.0)
RBC: 4.17 Mil/uL (ref 3.87–5.11)
RDW: 12.7 % (ref 11.5–15.5)
WBC: 7.8 K/uL (ref 4.0–10.5)

## 2023-07-18 LAB — TSH: TSH: 1.1 u[IU]/mL (ref 0.35–5.50)

## 2023-07-18 LAB — IBC + FERRITIN
Ferritin: 52.2 ng/mL (ref 10.0–291.0)
Iron: 94 ug/dL (ref 42–145)
Saturation Ratios: 27.4 % (ref 20.0–50.0)
TIBC: 343 ug/dL (ref 250.0–450.0)
Transferrin: 245 mg/dL (ref 212.0–360.0)

## 2023-07-19 MED ORDER — TERBINAFINE HCL 250 MG PO TABS
250.0000 mg | ORAL_TABLET | Freq: Every day | ORAL | 0 refills | Status: DC
Start: 2023-07-19 — End: 2023-08-27

## 2023-07-22 ENCOUNTER — Other Ambulatory Visit: Payer: Self-pay | Admitting: Primary Care

## 2023-07-22 DIAGNOSIS — G8929 Other chronic pain: Secondary | ICD-10-CM

## 2023-07-23 ENCOUNTER — Other Ambulatory Visit (HOSPITAL_COMMUNITY): Payer: Self-pay

## 2023-07-24 NOTE — Telephone Encounter (Signed)
 Per insurance, Mounjaro  is approved through 06/19/2024.  Thank you, Devere Pandy, PharmD Clinical Pharmacist  [place]  Direct Dial: [phone]

## 2023-08-20 DIAGNOSIS — E1165 Type 2 diabetes mellitus with hyperglycemia: Secondary | ICD-10-CM

## 2023-08-20 MED ORDER — TIRZEPATIDE 2.5 MG/0.5ML ~~LOC~~ SOAJ
2.5000 mg | SUBCUTANEOUS | 0 refills | Status: DC
Start: 2023-08-20 — End: 2023-09-17

## 2023-08-27 ENCOUNTER — Encounter: Payer: Self-pay | Admitting: Primary Care

## 2023-08-27 ENCOUNTER — Ambulatory Visit (INDEPENDENT_AMBULATORY_CARE_PROVIDER_SITE_OTHER): Payer: No Typology Code available for payment source | Admitting: Primary Care

## 2023-08-27 VITALS — BP 118/76 | HR 107 | Temp 97.3°F | Ht 64.0 in | Wt 171.0 lb

## 2023-08-27 DIAGNOSIS — Z0000 Encounter for general adult medical examination without abnormal findings: Secondary | ICD-10-CM

## 2023-08-27 DIAGNOSIS — E559 Vitamin D deficiency, unspecified: Secondary | ICD-10-CM | POA: Diagnosis not present

## 2023-08-27 DIAGNOSIS — E785 Hyperlipidemia, unspecified: Secondary | ICD-10-CM

## 2023-08-27 DIAGNOSIS — K529 Noninfective gastroenteritis and colitis, unspecified: Secondary | ICD-10-CM

## 2023-08-27 DIAGNOSIS — M542 Cervicalgia: Secondary | ICD-10-CM

## 2023-08-27 DIAGNOSIS — E1165 Type 2 diabetes mellitus with hyperglycemia: Secondary | ICD-10-CM

## 2023-08-27 DIAGNOSIS — I10 Essential (primary) hypertension: Secondary | ICD-10-CM

## 2023-08-27 DIAGNOSIS — Z1211 Encounter for screening for malignant neoplasm of colon: Secondary | ICD-10-CM

## 2023-08-27 DIAGNOSIS — K219 Gastro-esophageal reflux disease without esophagitis: Secondary | ICD-10-CM | POA: Diagnosis not present

## 2023-08-27 DIAGNOSIS — F419 Anxiety disorder, unspecified: Secondary | ICD-10-CM

## 2023-08-27 DIAGNOSIS — G8929 Other chronic pain: Secondary | ICD-10-CM

## 2023-08-27 DIAGNOSIS — Z7985 Long-term (current) use of injectable non-insulin antidiabetic drugs: Secondary | ICD-10-CM

## 2023-08-27 DIAGNOSIS — B351 Tinea unguium: Secondary | ICD-10-CM | POA: Diagnosis not present

## 2023-08-27 DIAGNOSIS — R053 Chronic cough: Secondary | ICD-10-CM

## 2023-08-27 DIAGNOSIS — Z1231 Encounter for screening mammogram for malignant neoplasm of breast: Secondary | ICD-10-CM

## 2023-08-27 LAB — VITAMIN D 25 HYDROXY (VIT D DEFICIENCY, FRACTURES): VITD: 33.14 ng/mL (ref 30.00–100.00)

## 2023-08-27 MED ORDER — TERBINAFINE HCL 250 MG PO TABS: 250.0000 mg | ORAL_TABLET | Freq: Every day | ORAL | 0 refills | Status: DC

## 2023-08-27 NOTE — Assessment & Plan Note (Signed)
Not taking vitamin D, repeat level pending. 

## 2023-08-27 NOTE — Assessment & Plan Note (Signed)
 Stable.  Continue gabapentin  600 mg HS

## 2023-08-27 NOTE — Assessment & Plan Note (Signed)
 Controlled.  Continue losartan  25 mg daily. CMP reviewed from July 2025

## 2023-08-27 NOTE — Assessment & Plan Note (Addendum)
 Little improvement, only took one month.  Resume terbinafine  250 mg daily. Reviewed CMP from July 2025.

## 2023-08-27 NOTE — Assessment & Plan Note (Signed)
 Controlled, continue Singulair  10 mg HS.

## 2023-08-27 NOTE — Assessment & Plan Note (Signed)
 Well controlled with A1C of 5.6 in July 2025.  Continue Mounjaro  2.5 mg weekly as she could not tolerate the 5 mg dose.  Follow up in 6 months.

## 2023-08-27 NOTE — Assessment & Plan Note (Signed)
 Controlled.  No concerns today.

## 2023-08-27 NOTE — Patient Instructions (Signed)
 Stop by the lab prior to leaving today. I will notify you of your results once received.   [facility].   You will receive a phone call for the colonoscopy.   Resume terbinafine  250 mg daily for toenail fungus.   Please schedule a follow up visit for 6 months for a diabetes check.  It was a pleasure to see you today!

## 2023-08-27 NOTE — Assessment & Plan Note (Signed)
 Controlled.  Continue nortriptyline  10 mg HS.

## 2023-08-27 NOTE — Assessment & Plan Note (Signed)
Repeat lipid panel pending.  Discussed the importance of a healthy diet and regular exercise in order for weight loss, and to reduce the risk of further co-morbidity. Continue atorvastatin 10 mg daily.  

## 2023-08-27 NOTE — Progress Notes (Signed)
 Subjective:    Patient ID: Cheyenne Flowers, female    DOB: [birth year], 63 y.o.   MRN: [id number]  History of Present Illness    Cheyenne Flowers is a very pleasant 63 y.o. female who presents today for complete physical and follow up of chronic conditions.  Immunizations: -Tetanus: Completed in 2020 -Influenza: due this season  -Shingles: Completed Shingrix series -Pneumonia: Completed 2019  Diet: Fair diet.  Exercise: No regular exercise.  Eye exam: Completes annually  Dental exam: Completes semi-annually    Pap Smear: Completed in 2022, follows with GYN. Mammogram: Completed in September 2024   Colonoscopy: Completed in 2019, due 2024 and has yet to complete.   BP Readings from Last 3 Encounters:  08/27/23 118/76  07/17/23 102/60  02/26/23 136/86   Wt Readings from Last 3 Encounters:  08/27/23 171 lb (77.6 kg)  07/17/23 169 lb (76.7 kg)  02/26/23 164 lb (74.4 kg)          Review of Systems  Constitutional:  Negative for unexpected weight change.  HENT:  Negative for rhinorrhea.   Respiratory:  Negative for cough and shortness of breath.   Cardiovascular:  Negative for chest pain.  Gastrointestinal:  Negative for constipation and diarrhea.  Genitourinary:  Negative for difficulty urinating and menstrual problem.  Musculoskeletal:  Negative for arthralgias and myalgias.  Skin:  Negative for rash.  Allergic/Immunologic: Negative for environmental allergies.  Neurological:  Negative for dizziness, numbness and headaches.  Psychiatric/Behavioral:  The patient is not nervous/anxious.          Past Medical History:  Diagnosis Date   Acute foreign body of right ear canal 01/20/2019   Allergy    Anxiety and depression    Arthritis    Chronic neck pain    Complication of anesthesia    COVID-19 virus infection 01/24/2022   Diverticulosis    Generalized abdominal pain 05/16/2017   GERD (gastroesophageal reflux disease)    Hyperlipidemia     Hypertension    Impacted cerumen of right ear 01/30/2022   Laceration of right ear canal 01/20/2019   Nausea vomiting and diarrhea 12/29/2021   PONV (postoperative nausea and vomiting)    Strep pharyngitis 12/29/2021   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HCC)    Type 2 diabetes mellitus (HCC)     Social History   Socioeconomic History   Marital status: Married    Spouse name: Not on file   Number of children: Not on file   Years of education: Not on file   Highest education level: Not on file  Occupational History   Not on file  Tobacco Use   Smoking status: Never   Smokeless tobacco: Never  Vaping Use   Vaping status: Never Used  Substance and Sexual Activity   Alcohol use: Never   Drug use: Never   Sexual activity: Not on file  Other Topics Concern   Not on file  Social History Narrative   Married.   5 children, 9 grandchildren.   Works in administration.    Enjoys watching her grandchildren, spending time with family.   Social Drivers of Corporate investment banker Strain: Not on file  Food Insecurity: Not on file  Transportation Needs: Not on file  Physical Activity: Not on file  Stress: Not on file  Social Connections: Not on file  Intimate Partner Violence: Not on file    Past Surgical History:  Procedure Laterality Date   CERVICAL FUSION  2007   CHOLECYSTECTOMY  DG GALL BLADDER  1990   EXTERNAL EAR SURGERY  2008, 2017   ORIF HUMERUS FRACTURE Left 11/12/2019   Procedure: open reduction internal fixation of left greater tuberosity fracture;  Surgeon: Melita Drivers, MD;  Location: [facility];  Service: Orthopedics;  Laterality: Left;     Family History  Problem Relation Age of Onset   Breast cancer Mother 66   COPD Mother    Diabetes Mother    Heart disease Mother    Diabetes Brother    Hypertension Brother     Allergies  Allergen Reactions   Latex Hives and Rash   Clindamycin Rash   Codeine Nausea Only    Other reaction(s): Other (See Comments) Sick  feeling    Quinolones Rash    Current Outpatient Medications on File Prior to Visit  Medication Sig Dispense Refill   atorvastatin  (LIPITOR) 10 MG tablet TAKE 1 TABLET BY MOUTH EVERY DAY FOR CHOLESTEROL 90 tablet 2   cetirizine  (ZYRTEC ) 10 MG tablet TAKE 1 TABLET BY MOUTH EVERY DAY as needed for allergies. 90 tablet 3   gabapentin  (NEURONTIN ) 600 MG tablet TAKE 1 TABLET (600 MG TOTAL) BY MOUTH AT BEDTIME. FOR NECK PAIN. 90 tablet 0   losartan  (COZAAR ) 25 MG tablet TAKE 1 TABLET BY MOUTH EVERY DAY FOR BLOOD PRESSURE 90 tablet 0   montelukast  (SINGULAIR ) 10 MG tablet TAKE 1 TABLET BY MOUTH AT BEDTIME FOR ALLERGIES 90 tablet 0   nortriptyline  (PAMELOR ) 10 MG capsule Take 1 capsule (10 mg total) by mouth at bedtime. For diarrhea. 90 capsule 0   tirzepatide  (MOUNJARO ) 2.5 MG/0.5ML Pen Inject 2.5 mg into the skin once a week. for diabetes. 6 mL 0   No current facility-administered medications on file prior to visit.    BP 118/76   Pulse (!) 107   Temp (!) 97.3 F (36.3 C) (Temporal)   Ht 5' 4 (1.626 m)   Wt 171 lb (77.6 kg)   SpO2 98%   BMI 29.35 kg/m  Objective:   Physical Exam HENT:     Right Ear: Tympanic membrane and ear canal normal.     Left Ear: Tympanic membrane and ear canal normal.  Eyes:     Pupils: Pupils are equal, round, and reactive to light.  Cardiovascular:     Rate and Rhythm: Normal rate and regular rhythm.  Pulmonary:     Effort: Pulmonary effort is normal.     Breath sounds: Normal breath sounds.  Abdominal:     General: Bowel sounds are normal.     Palpations: Abdomen is soft.     Tenderness: There is no abdominal tenderness.  Musculoskeletal:        General: Normal range of motion.     Cervical back: Neck supple.  Skin:    General: Skin is warm and dry.  Neurological:     Mental Status: She is alert and oriented to person, place, and time.     Cranial Nerves: No cranial nerve deficit.     Deep Tendon Reflexes:     Reflex Scores:      Patellar  reflexes are 2+ on the right side and 2+ on the left side. Psychiatric:        Mood and Affect: Mood normal.     Physical Exam        Assessment & Plan:  Preventative health care Assessment & Plan: Immunizations UTD. Pap smear due soon, she will schedule with GYN Mammogram due, orders placed. Colonoscopy due,  referral placed to GI  Discussed the importance of a healthy diet and regular exercise in order for weight loss, and to reduce the risk of further co-morbidity.  Exam stable. Labs pending.  Follow up in 1 year for repeat physical.    Onychomycosis Assessment & Plan: Little improvement, only took one month.  Resume terbinafine  250 mg daily. Reviewed CMP from July 2025.  Orders: -     Terbinafine  HCl; Take 1 tablet (250 mg total) by mouth daily. For toenail fungus  Dispense: 30 tablet; Refill: 0  Essential hypertension Assessment & Plan: Controlled.  Continue losartan  25 mg daily. CMP reviewed from July 2025   Gastroesophageal reflux disease, unspecified whether esophagitis present Assessment & Plan: Controlled.  Continue OTC treatment.   Chronic diarrhea Assessment & Plan: Controlled.  Continue nortriptyline  10 mg HS.    Type 2 diabetes mellitus with hyperglycemia, without long-term current use of insulin (HCC) Assessment & Plan: Well controlled with A1C of 5.6 in July 2025.  Continue Mounjaro  2.5 mg weekly as she could not tolerate the 5 mg dose.  Follow up in 6 months.   Vitamin D  deficiency Assessment & Plan: Not taking vitamin D , repeat level pending.  Orders: -     VITAMIN D  25 Hydroxy (Vit-D Deficiency, Fractures)  Hyperlipidemia, unspecified hyperlipidemia type Assessment & Plan: Repeat lipid panel pending.  Discussed the importance of a healthy diet and regular exercise in order for weight loss, and to reduce the risk of further co-morbidity. Continue atorvastatin  10 mg daily   Chronic neck pain Assessment &  Plan: Stable.  Continue gabapentin  600 mg HS   Anxiety and depression Assessment & Plan: Controlled.  No concerns today   Screening mammogram for breast cancer -     3D Screening Mammogram, Left and Right; Future  Screening for colon cancer -     Ambulatory referral to Gastroenterology  Chronic cough Assessment & Plan: Controlled, continue Singulair  10 mg HS.      Assessment and Plan Assessment & Plan         Comer MARLA Gaskins, NP

## 2023-08-27 NOTE — Assessment & Plan Note (Signed)
 Immunizations UTD. Pap smear due soon, she will schedule with GYN Mammogram due, orders placed. Colonoscopy due, referral placed to GI  Discussed the importance of a healthy diet and regular exercise in order for weight loss, and to reduce the risk of further co-morbidity.  Exam stable. Labs pending.  Follow up in 1 year for repeat physical.

## 2023-08-27 NOTE — Assessment & Plan Note (Signed)
 Controlled.  Continue OTC treatment.

## 2023-08-28 ENCOUNTER — Ambulatory Visit: Payer: Self-pay | Admitting: Primary Care

## 2023-09-13 ENCOUNTER — Other Ambulatory Visit: Payer: Self-pay | Admitting: Primary Care

## 2023-09-13 DIAGNOSIS — R053 Chronic cough: Secondary | ICD-10-CM

## 2023-09-17 ENCOUNTER — Other Ambulatory Visit: Payer: Self-pay | Admitting: Primary Care

## 2023-09-17 DIAGNOSIS — E1165 Type 2 diabetes mellitus with hyperglycemia: Secondary | ICD-10-CM

## 2023-09-17 DIAGNOSIS — K529 Noninfective gastroenteritis and colitis, unspecified: Secondary | ICD-10-CM

## 2023-09-17 MED ORDER — TIRZEPATIDE 5 MG/0.5ML ~~LOC~~ SOAJ: 5.0000 mg | SUBCUTANEOUS | 1 refills | Status: DC

## 2023-09-20 ENCOUNTER — Encounter: Payer: Self-pay | Admitting: *Deleted

## 2023-10-02 ENCOUNTER — Ambulatory Visit
Admission: RE | Admit: 2023-10-02 | Discharge: 2023-10-02 | Disposition: A | Discharge status: Home / Self Care | Source: Ambulatory Visit | Attending: Primary Care | Admitting: Primary Care

## 2023-10-02 DIAGNOSIS — Z1231 Encounter for screening mammogram for malignant neoplasm of breast: Secondary | ICD-10-CM | POA: Insufficient documentation

## 2023-10-21 ENCOUNTER — Other Ambulatory Visit: Payer: Self-pay | Admitting: Primary Care

## 2023-10-21 DIAGNOSIS — G8929 Other chronic pain: Secondary | ICD-10-CM

## 2023-11-06 ENCOUNTER — Other Ambulatory Visit: Payer: Self-pay | Admitting: Medical Genetics

## 2023-11-06 DIAGNOSIS — Z006 Encounter for examination for normal comparison and control in clinical research program: Secondary | ICD-10-CM

## 2023-11-14 ENCOUNTER — Other Ambulatory Visit: Payer: Self-pay | Admitting: Primary Care

## 2023-11-14 DIAGNOSIS — I10 Essential (primary) hypertension: Secondary | ICD-10-CM

## 2023-12-17 ENCOUNTER — Ambulatory Visit: Payer: Self-pay

## 2023-12-17 NOTE — Telephone Encounter (Signed)
 Next Appt With Family Medicine Michail Schroeder, MD) 12/18/2023 at 12:00 PM

## 2023-12-17 NOTE — Progress Notes (Unsigned)
     Cheyenne Postell T. Hajer Dwyer, MD, CAQ [facility] [address] [place], [address]  Phone: [phone]  FAX: [phone]  Cheyenne Flowers - 63 y.o. female  MRN [id number]  Date of Birth: 05/04/1960  Date: 12/18/2023  PCP: Gretta Comer POUR, NP  Referral: Gretta Comer POUR, NP  No chief complaint on file.  Subjective:   Cheyenne Flowers is a 63 y.o. very pleasant female patient with There is no height or weight on file to calculate BMI. who presents with the following:  Discussed the use of AI scribe software for clinical note transcription with the patient, who gave verbal consent to proceed.  Patient presents with an acute illness and cough. History of Present Illness     Review of Systems is noted in the HPI, as appropriate  Objective:   There were no vitals taken for this visit.  GEN: No acute distress; alert,appropriate. PULM: Breathing comfortably in no respiratory distress PSYCH: Normally interactive.   Laboratory and Imaging Data:  Assessment and Plan:   No diagnosis found. Assessment & Plan   Medication Management during today's office visit: No orders of the defined types were placed in this encounter.  There are no discontinued medications.  Orders placed today for conditions managed today: No orders of the defined types were placed in this encounter.   Disposition: No follow-ups on file.  Dragon [employer] One speech-to-text software was used for transcription in this dictation.  Possible transcriptional errors can occur using Animal nutritionist.   Signed,  Jacques DASEN. Nyzir Dubois, MD   Outpatient Encounter Medications as of 12/18/2023  Medication Sig   atorvastatin  (LIPITOR) 10 MG tablet TAKE 1 TABLET BY MOUTH EVERY DAY FOR CHOLESTEROL   cetirizine  (ZYRTEC ) 10 MG tablet TAKE 1 TABLET BY MOUTH EVERY DAY as needed for allergies.   gabapentin  (NEURONTIN ) 600 MG tablet Take 1 tablet (600 mg total) by  mouth at bedtime for neck pain.   losartan  (COZAAR ) 25 MG tablet Take 1 tablet (25 mg total) by mouth daily for blood pressure.   montelukast  (SINGULAIR ) 10 MG tablet Take 1 tablet (10 mg total) by mouth at bedtime.   nortriptyline  (PAMELOR ) 10 MG capsule Take 1 capsule (10 mg total) by mouth at bedtime. For diarrhea.   terbinafine  (LAMISIL ) 250 MG tablet Take 1 tablet (250 mg total) by mouth daily. For toenail fungus   tirzepatide  (MOUNJARO ) 5 MG/0.5ML Pen Inject 5 mg into the skin once a week. for diabetes.   No facility-administered encounter medications on file as of 12/18/2023.

## 2023-12-17 NOTE — Telephone Encounter (Signed)
 Noted. Agree with nursing triage decision. Appreciate Dr Copland's evaluation.

## 2023-12-17 NOTE — Telephone Encounter (Signed)
 FYI Only or Action Required?: FYI only for provider: appointment scheduled on 12/17/2023.  Patient was last seen in primary care on 08/27/2023 by Gretta Comer POUR, NP.  Called Nurse Triage reporting Cough.  Symptoms began Saturday.  Interventions attempted: Nothing.  Symptoms are: gradually worsening.  Triage Disposition: See Physician Within 24 Hours  Patient/caregiver understands and will follow disposition?: Yes    Copied from CRM #[id number]. Topic: Clinical - Red Word Triage >> Dec 17, 2023 12:10 PM Victoria A wrote: [facility] that prompted transfer to Nurse Triage: Patient has been continuously cough, headaches and production of mucus. Reason for Disposition  SEVERE coughing spells (e.g., whooping sound after coughing, vomiting after coughing)  Answer Assessment - Initial Assessment Questions 1. ONSET: When did the cough begin?      Saturday 2. SEVERITY: How bad is the cough today?      moderate 3. SPUTUM: Describe the color of your sputum (e.g., none, dry cough; clear, white, yellow, green)     clear 4. HEMOPTYSIS: Are you coughing up any blood? If Yes, ask: How much? (e.g., flecks, streaks, tablespoons, etc.)     no 5. DIFFICULTY BREATHING: Are you having difficulty breathing? If Yes, ask: How bad is it? (e.g., mild, moderate, severe)      no 6. FEVER: Do you have a fever? If Yes, ask: What is your temperature, how was it measured, and when did it start?     no 7. CARDIAC HISTORY: Do you have any history of heart disease? (e.g., heart attack, congestive heart failure)      na 8. LUNG HISTORY: Do you have any history of lung disease?  (e.g., pulmonary embolus, asthma, emphysema)     History bronchitis 9. PE RISK FACTORS: Do you have a history of blood clots? (or: recent major surgery, recent prolonged travel, bedridden)     na 10. OTHER SYMPTOMS: Do you have any other symptoms? (e.g., runny nose, wheezing, chest pain)       Sore throat,  headaches, clear mucous, funny taste in mouth, hoarseness 11. PREGNANCY: Is there any chance you are pregnant? When was your last menstrual period?       na 12. TRAVEL: Have you traveled out of the country in the last month? (e.g., travel history, exposures)       na  Protocols used: Cough - Acute Productive-A-AH

## 2023-12-18 ENCOUNTER — Ambulatory Visit: Admitting: Family Medicine

## 2023-12-18 ENCOUNTER — Encounter: Payer: Self-pay | Admitting: Family Medicine

## 2023-12-18 VITALS — BP 130/90 | HR 114 | Temp 98.8°F | Ht 64.0 in | Wt 176.5 lb

## 2023-12-18 DIAGNOSIS — J069 Acute upper respiratory infection, unspecified: Secondary | ICD-10-CM | POA: Diagnosis not present

## 2023-12-20 ENCOUNTER — Encounter: Payer: Self-pay | Admitting: Family Medicine

## 2023-12-20 MED ORDER — AZITHROMYCIN 250 MG PO TABS: ORAL_TABLET | ORAL | 0 refills | Status: DC

## 2023-12-20 MED ORDER — HYDROCODONE BIT-HOMATROP MBR 5-1.5 MG/5ML PO SOLN: 5.0000 mL | Freq: Every evening | ORAL | 0 refills | Status: AC | PRN

## 2023-12-20 MED ORDER — AZITHROMYCIN 250 MG PO TABS: ORAL_TABLET | ORAL | 0 refills | Status: AC

## 2023-12-20 MED ORDER — HYDROCODONE BIT-HOMATROP MBR 5-1.5 MG/5ML PO SOLN: 5.0000 mL | Freq: Every evening | ORAL | 0 refills | Status: DC | PRN

## 2023-12-26 ENCOUNTER — Ambulatory Visit: Payer: Self-pay

## 2023-12-26 NOTE — Telephone Encounter (Signed)
 FYI Only or Action Required?: Action required by provider: update on patient condition.  Patient was last seen in primary care on 12/18/2023 by Watt Mirza, MD.  Called Nurse Triage reporting Shoulder Pain.  Symptoms began several weeks ago. Worsened overnight  Interventions attempted: OTC medications: ibuprofen.  Symptoms are: gradually worsening.  Triage Disposition: See PCP When Office is Open (Within 3 Days)  Patient/caregiver understands and will follow disposition?: Yes  Copied from CRM #[id number]. Topic: Clinical - Red Word Triage >> Dec 26, 2023 10:49 AM Viola F wrote: Red Word that prompted transfer to Nurse Triage: Patient having left shoulder pain and now pain is going up to her ear lobe - requesting x-ray Reason for Disposition  Pain is worsened or caused by bending the neck  Answer Assessment - Initial Assessment Questions Patient with shoulder and neck surgery 3 years ago. Screws were placed. Shoulder has been aching/throbbing with activity but overnight she has started to have pain up into her left neck. Denies CP, SOB, dizziness, fever, or paresthesias Denies swelling or rash that she has been able to tell. No new movement changes. Needs a new pillow and has been worsening her pain. Pain worse pressing on top of her shoulder.  Ibuprofen with mild relief.   Requesting Xray of her shoulder to ensure no screws have moved. Appt Monday and placed on waitlist. Advised UC or ED over the weekend for any changes in pain or critical symptoms.   1. ONSET: When did the pain start?     Worsened over the last few weeks- overnight last night starting go up to neck 2. LOCATION: Where is the pain located?     Left shoulder up her neck 3. PAIN: How bad is the pain? (Scale 1-10; or mild, moderate, severe)     Throbbing 5/10 pain 4. WORK OR EXERCISE: Has there been any recent work or exercise that involved this part of the body?     Possibly due to pillow 5. CAUSE: What  do you think is causing the shoulder pain?     Previous neck and shoulder surgery on Left side 6. OTHER SYMPTOMS: Do you have any other symptoms? (e.g., neck pain, swelling, rash, fever, numbness, weakness)     denies  Protocols used: Shoulder Pain-A-AH

## 2023-12-26 NOTE — Telephone Encounter (Signed)
 Pt has OV on 12/30/23 with Mallie.

## 2023-12-30 ENCOUNTER — Encounter: Payer: Self-pay | Admitting: Primary Care

## 2023-12-30 ENCOUNTER — Ambulatory Visit: Admitting: Primary Care

## 2023-12-30 VITALS — BP 122/82 | HR 96 | Temp 98.4°F | Wt 178.6 lb

## 2023-12-30 DIAGNOSIS — G8929 Other chronic pain: Secondary | ICD-10-CM | POA: Diagnosis not present

## 2023-12-30 DIAGNOSIS — M25512 Pain in left shoulder: Secondary | ICD-10-CM | POA: Diagnosis not present

## 2023-12-30 MED ORDER — PREDNISONE 20 MG PO TABS: ORAL_TABLET | ORAL | 0 refills | Status: AC

## 2023-12-30 NOTE — Patient Instructions (Signed)
 Start prednisone  tablets. Take two tablets my mouth once daily in the morning for four days, then one tablet once daily in the morning for four days.   It was a pleasure to see you today!

## 2023-12-30 NOTE — Assessment & Plan Note (Signed)
 HPI and exam suggestive of bursitis.   Discussed options for treatment. Will start with oral steroid course. Start prednisone  tablets. Take two tablets my mouth once daily in the morning for four days, then one tablet once daily in the morning for four days.   We also discussed the option for steroid injection if needed.  She will update.

## 2023-12-30 NOTE — Progress Notes (Signed)
 "  Subjective:    Patient ID: Cheyenne Flowers, female    DOB: 12/27/1960, 63 y.o.   MRN: [id number]  Cheyenne Flowers is a very pleasant 63 y.o. female with a history of type 2 diabetes, hypertension, hyperlipidemia, chronic hip pain, chronic pain who presents today to discuss shoulder pain.   Symptom onset a few months ago with left anterior shoulder pain. Her pain is intermittent, waxes and wanes. Last week her pain increased with radiation up the left lateral neck. She describes her pain as achy. Two days ago her pain improved.   She's been taking both Advil and Tylenol . She denies injury/trauma, overuse, paresthesias.      Review of Systems  Musculoskeletal:  Positive for arthralgias.  Neurological:  Negative for numbness.         Past Medical History:  Diagnosis Date   Acute foreign body of right ear canal 01/20/2019   Allergy    Anxiety and depression    Arthritis    Chronic neck pain    Complication of anesthesia    COVID-19 virus infection 01/24/2022   Diverticulosis    Generalized abdominal pain 05/16/2017   GERD (gastroesophageal reflux disease)    Hyperlipidemia    Hypertension    Impacted cerumen of right ear 01/30/2022   Laceration of right ear canal 01/20/2019   Nausea vomiting and diarrhea 12/29/2021   PONV (postoperative nausea and vomiting)    Strep pharyngitis 12/29/2021   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HCC)    Type 2 diabetes mellitus (HCC)     Social History   Socioeconomic History   Marital status: Married    Spouse name: Not on file   Number of children: Not on file   Years of education: Not on file   Highest education level: Not on file  Occupational History   Not on file  Tobacco Use   Smoking status: Never   Smokeless tobacco: Never  Vaping Use   Vaping status: Never Used  Substance and Sexual Activity   Alcohol use: Never   Drug use: Never   Sexual activity: Not on file  Other Topics Concern   Not on file  Social History Narrative    Married.   5 children, 9 grandchildren.   Works in administration.    Enjoys watching her grandchildren, spending time with family.   Social Drivers of Health   Tobacco Use: Low Risk (12/30/2023)   Patient History    Smoking Tobacco Use: Never    Smokeless Tobacco Use: Never    Passive Exposure: Not on file  Financial Resource Strain: Not on file  Food Insecurity: Not on file  Transportation Needs: Not on file  Physical Activity: Not on file  Stress: Not on file  Social Connections: Not on file  Intimate Partner Violence: Not on file  Depression (PHQ2-9): Low Risk (12/18/2023)   Depression (PHQ2-9)    PHQ-2 Score: 2  Alcohol Screen: Not on file  Housing: Not on file  Utilities: Not on file  Health Literacy: Not on file    Past Surgical History:  Procedure Laterality Date   CERVICAL FUSION  2007   CHOLECYSTECTOMY     DG GALL BLADDER  1990   EXTERNAL EAR SURGERY  2008, 2017   ORIF HUMERUS FRACTURE Left 11/12/2019   Procedure: open reduction internal fixation of left greater tuberosity fracture;  Surgeon: Melita Drivers, MD;  Location: [facility];  Service: Orthopedics;  Laterality: Left;     Family History  Problem Relation Age  of Onset   Breast cancer Mother 4   COPD Mother    Diabetes Mother    Heart disease Mother    Diabetes Brother    Hypertension Brother     Allergies[1]  Medications Ordered Prior to Encounter[2]  BP 122/82 (BP Location: Left Arm, Patient Position: Sitting, Cuff Size: Large)   Pulse 96   Temp 98.4 F (36.9 C) (Temporal)   Wt 178 lb 9.6 oz (81 kg)   SpO2 96%   BMI 30.66 kg/m  Objective:   Physical Exam Cardiovascular:     Rate and Rhythm: Normal rate.  Pulmonary:     Effort: Pulmonary effort is normal.  Musculoskeletal:     Left shoulder: No swelling. Decreased range of motion. Normal strength.  Skin:    General: Skin is warm and dry.  Neurological:     Mental Status: She is alert.     Physical Exam         Assessment & Plan:  Chronic left shoulder pain Assessment & Plan: HPI and exam suggestive of bursitis.   Discussed options for treatment. Will start with oral steroid course. Start prednisone  tablets. Take two tablets my mouth once daily in the morning for four days, then one tablet once daily in the morning for four days.   We also discussed the option for steroid injection if needed.  She will update.   Orders: -     predniSONE ; Take two tablets my mouth once daily in the morning for four days, then one tablet once daily in the morning for four days.  Dispense: 12 tablet; Refill: 0    Assessment and Plan Assessment & Plan         Comer MARLA Gaskins, NP       [1]  Allergies Allergen Reactions   Latex Hives and Rash   Clindamycin Rash   Codeine Nausea Only    Other reaction(s): Other (See Comments) Sick feeling    Quinolones Rash  [2]  Current Outpatient Medications on File Prior to Visit  Medication Sig Dispense Refill   atorvastatin  (LIPITOR) 10 MG tablet TAKE 1 TABLET BY MOUTH EVERY DAY FOR CHOLESTEROL 90 tablet 2   gabapentin  (NEURONTIN ) 600 MG tablet Take 1 tablet (600 mg total) by mouth at bedtime for neck pain. 90 tablet 2   HYDROcodone  bit-homatropine (HYCODAN) 5-1.5 MG/5ML syrup Take 5 mLs by mouth at bedtime as needed for cough. 120 mL 0   losartan  (COZAAR ) 25 MG tablet Take 1 tablet (25 mg total) by mouth daily for blood pressure. 90 tablet 2   montelukast  (SINGULAIR ) 10 MG tablet Take 1 tablet (10 mg total) by mouth at bedtime. 90 tablet 2   nortriptyline  (PAMELOR ) 10 MG capsule Take 1 capsule (10 mg total) by mouth at bedtime. For diarrhea. 90 capsule 2   No current facility-administered medications on file prior to visit.   "

## 2024-01-06 DIAGNOSIS — E785 Hyperlipidemia, unspecified: Secondary | ICD-10-CM

## 2024-01-07 MED ORDER — ATORVASTATIN CALCIUM 10 MG PO TABS: ORAL_TABLET | ORAL | 1 refills | Status: AC

## 2024-03-05 ENCOUNTER — Ambulatory Visit: Admitting: Primary Care
